# Patient Record
Sex: Female | Born: 1950 | Race: White | Hispanic: No | Marital: Married | Smoking: Never smoker
Health system: Southern US, Community
[De-identification: ages and names within clinical notes are randomized; demographics above are authoritative.]

## PROBLEM LIST (undated history)

## (undated) DIAGNOSIS — F419 Anxiety disorder, unspecified: Secondary | ICD-10-CM

## (undated) DIAGNOSIS — J45909 Unspecified asthma, uncomplicated: Secondary | ICD-10-CM

## (undated) DIAGNOSIS — I10 Essential (primary) hypertension: Secondary | ICD-10-CM

## (undated) DIAGNOSIS — E559 Vitamin D deficiency, unspecified: Secondary | ICD-10-CM

## (undated) DIAGNOSIS — M810 Age-related osteoporosis without current pathological fracture: Secondary | ICD-10-CM

## (undated) HISTORY — PX: WISDOM TOOTH EXTRACTION: SHX21

## (undated) HISTORY — DX: Essential (primary) hypertension: I10

## (undated) HISTORY — DX: Age-related osteoporosis without current pathological fracture: M81.0

## (undated) HISTORY — DX: Unspecified asthma, uncomplicated: J45.909

## (undated) HISTORY — DX: Vitamin D deficiency, unspecified: E55.9

## (undated) HISTORY — DX: Anxiety disorder, unspecified: F41.9

---

## 1985-04-28 HISTORY — PX: TUBAL LIGATION: SHX77

## 1998-01-02 ENCOUNTER — Other Ambulatory Visit: Admission: RE | Admit: 1998-01-02 | Discharge: 1998-01-02 | Payer: Self-pay | Admitting: Obstetrics & Gynecology

## 1999-02-01 ENCOUNTER — Other Ambulatory Visit: Admission: RE | Admit: 1999-02-01 | Discharge: 1999-02-01 | Payer: Self-pay | Admitting: Obstetrics & Gynecology

## 1999-02-04 ENCOUNTER — Other Ambulatory Visit: Admission: RE | Admit: 1999-02-04 | Discharge: 1999-02-04 | Payer: Self-pay | Admitting: Obstetrics & Gynecology

## 1999-09-17 ENCOUNTER — Encounter: Admission: RE | Admit: 1999-09-17 | Discharge: 1999-09-17 | Payer: Self-pay | Admitting: Obstetrics & Gynecology

## 1999-09-17 ENCOUNTER — Encounter: Payer: Self-pay | Admitting: Obstetrics & Gynecology

## 1999-10-21 ENCOUNTER — Other Ambulatory Visit: Admission: RE | Admit: 1999-10-21 | Discharge: 1999-10-21 | Payer: Self-pay | Admitting: Obstetrics & Gynecology

## 2000-06-26 ENCOUNTER — Other Ambulatory Visit: Admission: RE | Admit: 2000-06-26 | Discharge: 2000-06-26 | Payer: Self-pay | Admitting: Obstetrics & Gynecology

## 2010-06-18 ENCOUNTER — Other Ambulatory Visit: Payer: Self-pay | Admitting: Obstetrics & Gynecology

## 2010-06-18 DIAGNOSIS — Z780 Asymptomatic menopausal state: Secondary | ICD-10-CM

## 2010-06-18 DIAGNOSIS — Z1231 Encounter for screening mammogram for malignant neoplasm of breast: Secondary | ICD-10-CM

## 2010-06-27 DIAGNOSIS — M810 Age-related osteoporosis without current pathological fracture: Secondary | ICD-10-CM

## 2010-06-27 HISTORY — DX: Age-related osteoporosis without current pathological fracture: M81.0

## 2010-07-10 ENCOUNTER — Ambulatory Visit
Admission: RE | Admit: 2010-07-10 | Discharge: 2010-07-10 | Disposition: A | Discharge status: Home / Self Care | Payer: Commercial Indemnity | Source: Ambulatory Visit | Attending: Obstetrics & Gynecology | Admitting: Obstetrics & Gynecology

## 2010-07-10 DIAGNOSIS — Z1231 Encounter for screening mammogram for malignant neoplasm of breast: Secondary | ICD-10-CM

## 2010-07-10 DIAGNOSIS — Z780 Asymptomatic menopausal state: Secondary | ICD-10-CM

## 2011-04-29 HISTORY — PX: CHOLECYSTECTOMY, LAPAROSCOPIC: SHX56

## 2011-05-29 DIAGNOSIS — E785 Hyperlipidemia, unspecified: Secondary | ICD-10-CM

## 2011-05-29 HISTORY — DX: Hyperlipidemia, unspecified: E78.5

## 2011-06-23 LAB — HM PAP SMEAR: HM PAP: NEGATIVE

## 2011-07-03 ENCOUNTER — Other Ambulatory Visit: Payer: Self-pay | Admitting: Obstetrics & Gynecology

## 2011-07-03 DIAGNOSIS — Z1231 Encounter for screening mammogram for malignant neoplasm of breast: Secondary | ICD-10-CM

## 2011-07-15 ENCOUNTER — Ambulatory Visit: Payer: Commercial Indemnity

## 2011-07-16 ENCOUNTER — Ambulatory Visit
Admission: RE | Admit: 2011-07-16 | Discharge: 2011-07-16 | Disposition: A | Discharge status: Home / Self Care | Payer: 59 | Source: Ambulatory Visit | Attending: Obstetrics & Gynecology | Admitting: Obstetrics & Gynecology

## 2011-07-16 DIAGNOSIS — Z1231 Encounter for screening mammogram for malignant neoplasm of breast: Secondary | ICD-10-CM

## 2012-05-27 DIAGNOSIS — J45909 Unspecified asthma, uncomplicated: Secondary | ICD-10-CM | POA: Insufficient documentation

## 2012-06-18 ENCOUNTER — Other Ambulatory Visit: Payer: Self-pay | Admitting: Obstetrics & Gynecology

## 2012-06-18 DIAGNOSIS — Z1231 Encounter for screening mammogram for malignant neoplasm of breast: Secondary | ICD-10-CM

## 2012-06-24 ENCOUNTER — Other Ambulatory Visit: Payer: Self-pay | Admitting: Obstetrics & Gynecology

## 2012-06-24 DIAGNOSIS — M810 Age-related osteoporosis without current pathological fracture: Secondary | ICD-10-CM

## 2012-07-16 ENCOUNTER — Ambulatory Visit: Payer: 59

## 2012-09-06 ENCOUNTER — Other Ambulatory Visit: Payer: Self-pay | Admitting: Nurse Practitioner

## 2012-09-06 NOTE — Telephone Encounter (Addendum)
eScribe request for refill on VITAMIN D 16109 Last filled - 06/24/12, #26 X 1 Last AEX - 06/24/12 Next AEX - 06/27/13 Last Vitamin D level - 06/24/12=37 Last filled at pharmacy on 05/20/12.  Pt given written RX on 06/24/12.  RX denied.

## 2012-09-08 ENCOUNTER — Ambulatory Visit
Admission: RE | Admit: 2012-09-08 | Discharge: 2012-09-08 | Disposition: A | Discharge status: Home / Self Care | Payer: 59 | Source: Ambulatory Visit | Attending: Obstetrics & Gynecology | Admitting: Obstetrics & Gynecology

## 2012-09-08 DIAGNOSIS — Z1231 Encounter for screening mammogram for malignant neoplasm of breast: Secondary | ICD-10-CM

## 2012-09-08 DIAGNOSIS — M810 Age-related osteoporosis without current pathological fracture: Secondary | ICD-10-CM

## 2012-09-13 ENCOUNTER — Telehealth: Payer: Self-pay | Admitting: *Deleted

## 2012-09-13 NOTE — Telephone Encounter (Signed)
Message copied by Osie Bond on Mon Sep 13, 2012  1:14 PM ------      Message from: Ria Comment R      Created: Mon Sep 13, 2012  1:08 PM       Let patient know that BMD looks better this time compared to 06/2010.  There has been a significant 3.9 % increase of BMD at the spine.  (Spine is still in the osteoporosis range)  No change at the hip.  She must continue on calcium, vit D and getting exercise especially upper body weights.  Repeat in 2 years. ------

## 2012-09-13 NOTE — Telephone Encounter (Signed)
Left VM for pt to return my call in regards to DEXA results.

## 2012-09-14 ENCOUNTER — Telehealth: Payer: Self-pay | Admitting: *Deleted

## 2012-09-14 NOTE — Telephone Encounter (Signed)
Pt is aware of DEXA results and is agreeable to exercise and vitamin D intake.

## 2012-09-14 NOTE — Telephone Encounter (Signed)
Message copied by Osie Bond on Tue Sep 14, 2012  9:09 AM ------      Message from: Ria Comment R      Created: Mon Sep 13, 2012  1:08 PM       Let patient know that BMD looks better this time compared to 06/2010.  There has been a significant 3.9 % increase of BMD at the spine.  (Spine is still in the osteoporosis range)  No change at the hip.  She must continue on calcium, vit D and getting exercise especially upper body weights.  Repeat in 2 years. ------

## 2012-10-14 ENCOUNTER — Other Ambulatory Visit: Payer: Self-pay | Admitting: Nurse Practitioner

## 2013-06-24 ENCOUNTER — Encounter: Payer: Self-pay | Admitting: Nurse Practitioner

## 2013-06-27 ENCOUNTER — Ambulatory Visit (INDEPENDENT_AMBULATORY_CARE_PROVIDER_SITE_OTHER): Payer: BC Managed Care – PPO | Admitting: Nurse Practitioner

## 2013-06-27 ENCOUNTER — Encounter: Payer: Self-pay | Admitting: Nurse Practitioner

## 2013-06-27 VITALS — BP 124/76 | HR 68 | Ht 62.0 in | Wt 211.0 lb

## 2013-06-27 DIAGNOSIS — R82998 Other abnormal findings in urine: Secondary | ICD-10-CM

## 2013-06-27 DIAGNOSIS — I10 Essential (primary) hypertension: Secondary | ICD-10-CM | POA: Insufficient documentation

## 2013-06-27 DIAGNOSIS — Z01419 Encounter for gynecological examination (general) (routine) without abnormal findings: Secondary | ICD-10-CM

## 2013-06-27 DIAGNOSIS — F411 Generalized anxiety disorder: Secondary | ICD-10-CM | POA: Insufficient documentation

## 2013-06-27 DIAGNOSIS — R8290 Unspecified abnormal findings in urine: Secondary | ICD-10-CM

## 2013-06-27 DIAGNOSIS — M810 Age-related osteoporosis without current pathological fracture: Secondary | ICD-10-CM | POA: Insufficient documentation

## 2013-06-27 DIAGNOSIS — Z0000 Encounter for general adult medical examination without abnormal findings: Secondary | ICD-10-CM

## 2013-06-27 DIAGNOSIS — F419 Anxiety disorder, unspecified: Secondary | ICD-10-CM | POA: Insufficient documentation

## 2013-06-27 DIAGNOSIS — E559 Vitamin D deficiency, unspecified: Secondary | ICD-10-CM | POA: Insufficient documentation

## 2013-06-27 LAB — POCT URINALYSIS DIPSTICK
BILIRUBIN UA: NEGATIVE
GLUCOSE UA: NEGATIVE
Ketones, UA: NEGATIVE
NITRITE UA: NEGATIVE
PH UA: 5
Protein, UA: NEGATIVE
Urobilinogen, UA: NEGATIVE

## 2013-06-27 LAB — HEMOGLOBIN, FINGERSTICK: Hemoglobin, fingerstick: 14 g/dL (ref 12.0–16.0)

## 2013-06-27 MED ORDER — VITAMIN D (ERGOCALCIFEROL) 1.25 MG (50000 UNIT) PO CAPS: ORAL_CAPSULE | ORAL | Status: DC

## 2013-06-27 NOTE — Patient Instructions (Signed)

## 2013-06-27 NOTE — Progress Notes (Signed)
Patient ID: Joanna Preston, female   DOB: [birth year], 63 y.o.   MRN: [id number] 63 y.o. G2P1001 Married Caucasian Fe here for annual exam. Went on part time then now retired since April 27, 2013.  Now spending more time with walking and now does own house keeping.   No LMP recorded. Patient is postmenopausal.          Sexually active: yes  The current method of family planning is post menopausal status.    Exercising: yes  Home exercise routine includes walking 1-2 miles two times daily. Smoker:  no  Health Maintenance: Pap:  06/23/11, WNL, neg HR HPV MMG:  09/08/12, Bi-Rads 1: negative Colonoscopy:  06/2008, "normal" repeat in 10 years IFOB with PCP BMD:  09/14/12, -2.4/-0.6 TDaP:  2015 Shingles vaccine is upcoming  Labs:  HB: 14.0 Urine:  Trace RBC, 1+ leuk's   reports that she has never smoked. She has never used smokeless tobacco. She reports that she does not drink alcohol or use illicit drugs.  Past Medical History  Diagnosis Date  . Vitamin D deficiency   . Asthma   . Osteoporosis 3/12    spine/nl hip  . Hypertension   . Anxiety     Past Surgical History  Procedure Laterality Date  . Cholecystectomy, laparoscopic  2013  . Tubal ligation Bilateral 1987  . Wisdom tooth extraction  age 24    Current Outpatient Prescriptions  Medication Sig Dispense Refill  . Fluticasone-Salmeterol (ADVAIR) 100-50 MCG/DOSE AEPB Inhale 1 puff into the lungs 2 (two) times daily.      Marland Kitchen lisinopril-hydrochlorothiazide (PRINZIDE,ZESTORETIC) 20-12.5 MG per tablet Take 1 tablet by mouth daily.      . Multiple Vitamin (MULTIVITAMIN) tablet Take 1 tablet by mouth daily.      . sertraline (ZOLOFT) 100 MG tablet Take 100 mg by mouth daily.      . Vitamin D, Ergocalciferol, (DRISDOL) 50000 UNITS CAPS take 1 capsule by mouth every week  26 capsule  3   No current facility-administered medications for this visit.    Family History  Problem Relation Age of Onset  . Heart disease Mother   . Heart  failure Mother   . Hypertension Mother   . Hyperlipidemia Mother   . Heart disease Father   . Lung cancer Brother     ROS:  Pertinent items are noted in HPI.  Otherwise, a comprehensive ROS was negative.  Exam:   BP 124/76  Pulse 68  Ht 5\' 2"  (1.575 m)  Wt 211 lb (95.709 kg)  BMI 38.58 kg/m2 Height: 5\' 2"  (157.5 cm)  Ht Readings from Last 3 Encounters:  06/27/13 5\' 2"  (1.575 m)    General appearance: alert, cooperative and appears stated age Head: Normocephalic, without obvious abnormality, atraumatic Neck: no adenopathy, supple, symmetrical, trachea midline and thyroid normal to inspection and palpation Lungs: clear to auscultation bilaterally Breasts: normal appearance, no masses or tenderness Heart: regular rate and rhythm Abdomen: soft, non-tender; no masses,  no organomegaly Extremities: extremities normal, atraumatic, no cyanosis or edema Skin: Skin color, texture, turgor normal. No rashes or lesions Lymph nodes: Cervical, supraclavicular, and axillary nodes normal. No abnormal inguinal nodes palpated Neurologic: Grossly normal   Pelvic: External genitalia:  no lesions              Urethra:  normal appearing urethra with no masses, tenderness or lesions              Bartholin's and Skene's: normal  Vagina: normal appearing vagina with normal color and discharge, no lesions              Cervix: anteverted              Pap taken: no Bimanual Exam:  Uterus:  normal size, contour, position, consistency, mobility, non-tender              Adnexa: no mass, fullness, tenderness               Rectovaginal: Confirms               Anus:  normal sphincter tone, no lesions  A:  Well Woman with normal exam  Postmenopausal - no HRT  History of Vit D deficiency with Osteoporosis  HTN and anxiety -grief reaction with loss of mother 04/23/13  R/O UTI  P:   Pap smear as per guidelines   Mammogram due 08/2013  Refill Vit D for a year - follow with labs  Continue  exercise  Will follow with urine culture - asymptomatic  Counseled on breast self exam, adequate intake of calcium and vitamin D, diet and exercise, Kegel's exercises return annually or prn  An After Visit Summary was printed and given to the patient.

## 2013-06-28 LAB — VITAMIN D 25 HYDROXY (VIT D DEFICIENCY, FRACTURES): VIT D 25 HYDROXY: 33 ng/mL (ref 30–89)

## 2013-06-29 ENCOUNTER — Telehealth: Payer: Self-pay | Admitting: *Deleted

## 2013-06-29 NOTE — Telephone Encounter (Signed)
Message copied by Luisa DagoPHILLIPS, Porter Nakama C on Wed Jun 29, 2013 10:50 AM ------      Message from: Roanna BanningGRUBB, PATRICIA R      Created: Tue Jun 28, 2013  8:47 AM       Let patient know that Vit D is low at 4033, follow protocol ------

## 2013-06-29 NOTE — Telephone Encounter (Signed)
I have attempted to contact this patient by phone with the following results: left message to return my call on answering machine (home/mobile).  

## 2013-06-29 NOTE — Telephone Encounter (Signed)
Spoke with patient. Message from Lauro FranklinPatricia Rolen-Grubb, FNP given. Verbalized understanding. Advised rx sent. To take per instructions.

## 2013-06-29 NOTE — Progress Notes (Signed)
Encounter reviewed by Dr. Brook Silva.  

## 2013-06-29 NOTE — Telephone Encounter (Signed)
Returning a call to Stephanie. °

## 2013-07-03 ENCOUNTER — Other Ambulatory Visit: Payer: Self-pay | Admitting: Nurse Practitioner

## 2013-07-03 DIAGNOSIS — N390 Urinary tract infection, site not specified: Secondary | ICD-10-CM

## 2013-07-03 LAB — URINE CULTURE: Colony Count: 15000

## 2013-07-03 MED ORDER — CIPROFLOXACIN HCL 500 MG PO TABS: 500.0000 mg | ORAL_TABLET | Freq: Two times a day (BID) | ORAL | Status: DC

## 2013-07-25 ENCOUNTER — Ambulatory Visit (INDEPENDENT_AMBULATORY_CARE_PROVIDER_SITE_OTHER): Payer: BC Managed Care – PPO | Admitting: *Deleted

## 2013-07-25 VITALS — BP 130/88 | Resp 14 | Ht 62.0 in | Wt 211.0 lb

## 2013-07-25 DIAGNOSIS — N390 Urinary tract infection, site not specified: Secondary | ICD-10-CM

## 2013-07-25 NOTE — Progress Notes (Signed)
Patient is here for urine TOC; patient states she is feeling better.  Urine culture drawn up and sent to lab for analysis.  Patient aware someone will contact her with the results.  Routed to provider for review, encounter closed.

## 2013-07-26 LAB — URINE CULTURE: Colony Count: 70000

## 2013-09-20 ENCOUNTER — Other Ambulatory Visit: Payer: Self-pay

## 2013-09-20 DIAGNOSIS — Z1231 Encounter for screening mammogram for malignant neoplasm of breast: Secondary | ICD-10-CM

## 2013-09-28 ENCOUNTER — Ambulatory Visit
Admission: RE | Admit: 2013-09-28 | Discharge: 2013-09-28 | Disposition: A | Discharge status: Home / Self Care | Payer: BC Managed Care – PPO | Source: Ambulatory Visit

## 2013-09-28 DIAGNOSIS — Z1231 Encounter for screening mammogram for malignant neoplasm of breast: Secondary | ICD-10-CM

## 2014-02-27 ENCOUNTER — Encounter: Payer: Self-pay | Admitting: Nurse Practitioner

## 2014-06-29 ENCOUNTER — Encounter: Payer: Self-pay | Admitting: Nurse Practitioner

## 2014-06-29 ENCOUNTER — Ambulatory Visit (INDEPENDENT_AMBULATORY_CARE_PROVIDER_SITE_OTHER): Payer: BLUE CROSS/BLUE SHIELD | Admitting: Nurse Practitioner

## 2014-06-29 VITALS — BP 120/72 | HR 72 | Ht 63.0 in | Wt 208.0 lb

## 2014-06-29 DIAGNOSIS — Z0000 Encounter for general adult medical examination without abnormal findings: Secondary | ICD-10-CM | POA: Diagnosis not present

## 2014-06-29 DIAGNOSIS — E559 Vitamin D deficiency, unspecified: Secondary | ICD-10-CM

## 2014-06-29 DIAGNOSIS — R8290 Unspecified abnormal findings in urine: Secondary | ICD-10-CM | POA: Diagnosis not present

## 2014-06-29 DIAGNOSIS — Z01419 Encounter for gynecological examination (general) (routine) without abnormal findings: Secondary | ICD-10-CM | POA: Diagnosis not present

## 2014-06-29 LAB — POCT URINALYSIS DIPSTICK
Bilirubin, UA: NEGATIVE
Glucose, UA: NEGATIVE
Ketones, UA: NEGATIVE
NITRITE UA: NEGATIVE
PH UA: 6.5
PROTEIN UA: NEGATIVE
Urobilinogen, UA: NEGATIVE

## 2014-06-29 LAB — HEMOGLOBIN, FINGERSTICK: HEMOGLOBIN, FINGERSTICK: 14.8 g/dL (ref 12.0–16.0)

## 2014-06-29 MED ORDER — VITAMIN D (ERGOCALCIFEROL) 1.25 MG (50000 UNIT) PO CAPS: ORAL_CAPSULE | ORAL | Status: DC

## 2014-06-29 NOTE — Patient Instructions (Signed)

## 2014-06-29 NOTE — Progress Notes (Signed)
Patient ID: Joanna Preston, female   DOB: August 21, 1950, 64 y.o.   MRN: [id number] 64 y.o. G2P1001 Married  Caucasian Fe here for annual exam.  No new health problems.  Patient's last menstrual period was 04/28/1988 (approximate).          Sexually active: Yes.    The current method of family planning is post menopausal status.    Exercising: Yes.    Home exercise routine includes walking dog 2 miles everyday.  Strength training with light weights.. Smoker:  no  Health Maintenance: Pap:  06/23/11, negative with neg HR HPV MMG:  09/28/13, Bi-Rads 1:  Negative  Colonoscopy:  06/2008, "normal" repeat in 10 years, IFOB with PCP BMD:   09/14/12, -2.4/-0.6 TDaP:  2015 Shingles: 09/26/13 Labs:  HB:  14.8  Urine:  Trace leuk's, trace RBC   reports that she has never smoked. She has never used smokeless tobacco. She reports that she does not drink alcohol or use illicit drugs.  Past Medical History  Diagnosis Date  . Vitamin D deficiency   . Asthma   . Osteoporosis 3/12    spine/nl hip  . Hypertension   . Anxiety     Past Surgical History  Procedure Laterality Date  . Cholecystectomy, laparoscopic  2013  . Tubal ligation Bilateral 1987  . Wisdom tooth extraction  age 30    Current Outpatient Prescriptions  Medication Sig Dispense Refill  . atorvastatin (LIPITOR) 10 MG tablet Take 10 mg by mouth daily.  0  . Fluticasone-Salmeterol (ADVAIR) 100-50 MCG/DOSE AEPB Inhale 1 puff into the lungs 2 (two) times daily.    Marland Kitchen lisinopril-hydrochlorothiazide (PRINZIDE,ZESTORETIC) 20-12.5 MG per tablet Take 1 tablet by mouth daily.    . Multiple Vitamin (MULTIVITAMIN) tablet Take 1 tablet by mouth daily.    . sertraline (ZOLOFT) 50 MG tablet Take 50 mg by mouth daily.  0  . Vitamin D, Ergocalciferol, (DRISDOL) 50000 UNITS CAPS capsule take 1 capsule by mouth every week (Patient not taking: Reported on 06/29/2014) 30 capsule 3   No current facility-administered medications for this visit.    Family History   Problem Relation Age of Onset  . Heart disease Mother   . Heart failure Mother   . Hypertension Mother   . Hyperlipidemia Mother   . Heart disease Father   . Lung cancer Brother     ROS:  Pertinent items are noted in HPI.  Otherwise, a comprehensive ROS was negative.  Exam:   BP 120/72 mmHg  Pulse 72  Ht  (1.6 m)  Wt 208 lb (94.348 kg)  BMI 36.85 kg/m2  LMP 04/28/1988 (Approximate) Height:  (160 cm) Ht Readings from Last 3 Encounters:  06/29/14  (1.6 m)  07/25/13  (1.575 m)  06/27/13  (1.575 m)    General appearance: alert, cooperative and appears stated age Head: Normocephalic, without obvious abnormality, atraumatic Neck: no adenopathy, supple, symmetrical, trachea midline and thyroid normal to inspection and palpation Lungs: clear to auscultation bilaterally Breasts: normal appearance, no masses or tenderness Heart: regular rate and rhythm Abdomen: soft, non-tender; no masses,  no organomegaly Extremities: extremities normal, atraumatic, no cyanosis or edema Skin: Skin color, texture, turgor normal. No rashes or lesions Lymph nodes: Cervical, supraclavicular, and axillary nodes normal. No abnormal inguinal nodes palpated Neurologic: Grossly normal   Pelvic: External genitalia:  no lesions              Urethra:  normal appearing urethra with no masses,  tenderness or lesions              Bartholin's and Skene's: normal                 Vagina: normal appearing vagina with normal color and discharge, no lesions              Cervix: anteverted              Pap taken: No. Bimanual Exam:  Uterus:  normal size, contour, position, consistency, mobility, non-tender              Adnexa: no mass, fullness, tenderness               Rectovaginal: Confirms               Anus:  normal sphincter tone, no lesions  Chaperone present:  no  A:  Well Woman with normal exam  Postmenopausal - no HRT History of Vit D deficiency with  Osteoporosis HTN and anxiety  R/O UTI   P:   Reviewed health and wellness pertinent to exam  Pap smear taken today  Mammogram is due 6/16  Refill Vit D for a year, follow with Vit D results  Follow with urine results  Counseled on breast self exam, mammography screening, adequate intake of calcium and vitamin D, diet and exercise return annually or prn  An After Visit Summary was printed and given to the patient.

## 2014-06-30 LAB — URINE CULTURE

## 2014-06-30 LAB — VITAMIN D 25 HYDROXY (VIT D DEFICIENCY, FRACTURES): Vit D, 25-Hydroxy: 22 ng/mL — ABNORMAL LOW (ref 30–100)

## 2014-07-03 NOTE — Progress Notes (Signed)
Encounter reviewed by Dr. Brook Silva.  

## 2014-09-12 ENCOUNTER — Other Ambulatory Visit: Payer: Self-pay

## 2014-09-12 DIAGNOSIS — Z1231 Encounter for screening mammogram for malignant neoplasm of breast: Secondary | ICD-10-CM

## 2014-10-02 ENCOUNTER — Encounter (INDEPENDENT_AMBULATORY_CARE_PROVIDER_SITE_OTHER): Payer: Self-pay

## 2014-10-02 ENCOUNTER — Ambulatory Visit
Admission: RE | Admit: 2014-10-02 | Discharge: 2014-10-02 | Disposition: A | Discharge status: Home / Self Care | Payer: BLUE CROSS/BLUE SHIELD | Source: Ambulatory Visit

## 2014-10-02 DIAGNOSIS — Z1231 Encounter for screening mammogram for malignant neoplasm of breast: Secondary | ICD-10-CM

## 2015-07-03 ENCOUNTER — Encounter: Payer: Self-pay | Admitting: Nurse Practitioner

## 2015-07-03 ENCOUNTER — Ambulatory Visit (INDEPENDENT_AMBULATORY_CARE_PROVIDER_SITE_OTHER): Payer: BLUE CROSS/BLUE SHIELD | Admitting: Nurse Practitioner

## 2015-07-03 VITALS — BP 116/80 | HR 68 | Ht 63.0 in | Wt 210.0 lb

## 2015-07-03 DIAGNOSIS — Z0000 Encounter for general adult medical examination without abnormal findings: Secondary | ICD-10-CM | POA: Diagnosis not present

## 2015-07-03 DIAGNOSIS — Z01419 Encounter for gynecological examination (general) (routine) without abnormal findings: Secondary | ICD-10-CM

## 2015-07-03 DIAGNOSIS — E559 Vitamin D deficiency, unspecified: Secondary | ICD-10-CM

## 2015-07-03 LAB — POCT URINALYSIS DIPSTICK
Bilirubin, UA: NEGATIVE
Blood, UA: NEGATIVE
Glucose, UA: NEGATIVE
Ketones, UA: NEGATIVE
NITRITE UA: NEGATIVE
Protein, UA: NEGATIVE
UROBILINOGEN UA: NEGATIVE
pH, UA: 5

## 2015-07-03 MED ORDER — VITAMIN D (ERGOCALCIFEROL) 1.25 MG (50000 UNIT) PO CAPS: ORAL_CAPSULE | ORAL | Status: DC

## 2015-07-03 NOTE — Patient Instructions (Signed)

## 2015-07-03 NOTE — Progress Notes (Signed)
Patient ID: Joanna Preston, female   DOB: [birth year], 65 y.o.   MRN: [id number]  65 y.o. G2P1001 Married  Caucasian Fe here for annual exam.  Last HGB AIC 02/2015 was 5.9.  She feels well without any new health problems.  Patient's last menstrual period was 04/28/1988 (approximate).          Sexually active: Yes.    The current method of family planning is tubal ligation and post menopausal status.    Exercising: Yes.    Home exercise routine includes walking trails 6 miles per day. Smoker:  no  Health Maintenance: Pap:  06/23/11, WNL, neg HR HPV MMG: 10/02/14, Bi-Rads 1: Negative Colonoscopy:  06/2008, "normal" repeat in 10 years, Dr. Loreta AveMann BMD:  09/14/12 T-Score, -2.4 Spine / -0.6 Left Femur Neck TDaP: 06/20/13 Shingles: 09/26/13 Pneumonia: Not indicated due to age Hep C and HIV: discuss today Labs: HB: 13.2   Urine: trace leuk's - no symptoms   reports that she has never smoked. She has never used smokeless tobacco. She reports that she does not drink alcohol or use illicit drugs.  Past Medical History  Diagnosis Date  . Vitamin D deficiency   . Asthma   . Osteoporosis 3/12    spine/nl hip  . Hypertension   . Anxiety     Past Surgical History  Procedure Laterality Date  . Cholecystectomy, laparoscopic  2013  . Tubal ligation Bilateral 1987  . Wisdom tooth extraction  age 65    Current Outpatient Prescriptions  Medication Sig Dispense Refill  . atorvastatin (LIPITOR) 10 MG tablet Take 10 mg by mouth daily.  0  . Fluticasone-Salmeterol (ADVAIR) 100-50 MCG/DOSE AEPB Inhale 1 puff into the lungs 2 (two) times daily.    Marland Kitchen. lisinopril-hydrochlorothiazide (PRINZIDE,ZESTORETIC) 20-12.5 MG per tablet Take 1 tablet by mouth daily.    . Multiple Vitamin (MULTIVITAMIN) tablet Take 1 tablet by mouth daily.    . sertraline (ZOLOFT) 50 MG tablet Take 50 mg by mouth daily.  0  . Vitamin D, Ergocalciferol, (DRISDOL) 50000 units CAPS capsule take 1 capsule by mouth every week 30 capsule 3   No  current facility-administered medications for this visit.    Family History  Problem Relation Age of Onset  . Heart disease Mother   . Heart failure Mother   . Hypertension Mother   . Hyperlipidemia Mother   . Heart disease Father   . Lung cancer Brother     ROS:  Pertinent items are noted in HPI.  Otherwise, a comprehensive ROS was negative.  Exam:   BP 116/80 mmHg  Pulse 68  Ht 5\' 3"  (1.6 m)  Wt 210 lb (95.255 kg)  BMI 37.21 kg/m2  LMP 04/28/1988 (Approximate) Height: 5\' 3"  (160 cm) Ht Readings from Last 3 Encounters:  07/03/15 5\' 3"  (1.6 m)  06/29/14 5\' 3"  (1.6 m)  07/25/13 5\' 2"  (1.575 m)    General appearance: alert, cooperative and appears stated age Head: Normocephalic, without obvious abnormality, atraumatic Neck: no adenopathy, supple, symmetrical, trachea midline and thyroid normal to inspection and palpation Lungs: clear to auscultation bilaterally Breasts: normal appearance, no masses or tenderness Heart: regular rate and rhythm Abdomen: soft, non-tender; no masses,  no organomegaly Extremities: extremities normal, atraumatic, no cyanosis or edema Skin: Skin color, texture, turgor normal. No rashes or lesions Lymph nodes: Cervical, supraclavicular, and axillary nodes normal. No abnormal inguinal nodes palpated Neurologic: Grossly normal   Pelvic: External genitalia:  no lesions  Urethra:  normal appearing urethra with no masses, tenderness or lesions              Bartholin's and Skene's: normal                 Vagina: normal appearing vagina with normal color and discharge, no lesions              Cervix: anteverted              Pap taken: Yes.   Bimanual Exam:  Uterus:  normal size, contour, position, consistency, mobility, non-tender              Adnexa: no mass, fullness, tenderness               Rectovaginal: Confirms               Anus:  normal sphincter tone, no lesions  Chaperone present: yes  A:  Well Woman with normal  exam  Postmenopausal - no HRT History of Vit D deficiency with Osteoporosis HTN and anxiety    P:   Reviewed health and wellness pertinent to exam  Pap smear as above  Mammogram is due 09/2015  Follow with labs  Refill on Vit D for a year  Counseled on breast self exam, mammography screening, adequate intake of calcium and vitamin D, diet and exercise, Kegel's exercises return annually or prn  An After Visit Summary was printed and given to the patient.

## 2015-07-04 LAB — HIV ANTIBODY (ROUTINE TESTING W REFLEX): HIV: NONREACTIVE

## 2015-07-04 LAB — HEPATITIS C ANTIBODY: HCV Ab: NEGATIVE

## 2015-07-04 LAB — VITAMIN D 25 HYDROXY (VIT D DEFICIENCY, FRACTURES): Vit D, 25-Hydroxy: 36 ng/mL (ref 30–100)

## 2015-07-05 LAB — IPS PAP TEST WITH HPV

## 2015-07-06 LAB — HEMOGLOBIN, FINGERSTICK: HEMOGLOBIN, FINGERSTICK: 13.2 g/dL (ref 12.0–16.0)

## 2015-07-06 NOTE — Progress Notes (Signed)
Encounter reviewed by Dr. Janean SarkBrook Amundson C. Preston. Consider bone density study.

## 2015-07-17 ENCOUNTER — Telehealth: Payer: Self-pay | Admitting: Nurse Practitioner

## 2015-07-17 ENCOUNTER — Other Ambulatory Visit: Payer: Self-pay | Admitting: Nurse Practitioner

## 2015-07-17 DIAGNOSIS — M810 Age-related osteoporosis without current pathological fracture: Secondary | ICD-10-CM

## 2015-07-17 NOTE — Telephone Encounter (Signed)
Patient was called back about our discussion about her BMD.  She wanted us to order BMD instead of PCP.  I had failed to put in order and now that is done.  She will get BMD in June when she gets Mammo.

## 2015-09-03 ENCOUNTER — Other Ambulatory Visit: Payer: Self-pay | Admitting: Nurse Practitioner

## 2015-09-03 DIAGNOSIS — Z1231 Encounter for screening mammogram for malignant neoplasm of breast: Secondary | ICD-10-CM

## 2015-10-16 ENCOUNTER — Ambulatory Visit
Admission: RE | Admit: 2015-10-16 | Discharge: 2015-10-16 | Disposition: A | Discharge status: Home / Self Care | Payer: BLUE CROSS/BLUE SHIELD | Source: Ambulatory Visit | Attending: Nurse Practitioner | Admitting: Nurse Practitioner

## 2015-10-16 DIAGNOSIS — M810 Age-related osteoporosis without current pathological fracture: Secondary | ICD-10-CM

## 2015-10-16 DIAGNOSIS — Z1231 Encounter for screening mammogram for malignant neoplasm of breast: Secondary | ICD-10-CM

## 2016-02-26 ENCOUNTER — Other Ambulatory Visit: Payer: Self-pay

## 2016-02-26 DIAGNOSIS — E559 Vitamin D deficiency, unspecified: Secondary | ICD-10-CM

## 2016-02-26 MED ORDER — VITAMIN D (ERGOCALCIFEROL) 1.25 MG (50000 UNIT) PO CAPS: ORAL_CAPSULE | ORAL | 0 refills | Status: DC

## 2016-02-26 NOTE — Telephone Encounter (Signed)
Medication refill request: Vitamin D 50,000 units Last AEX:  07/03/15 PG Next AEX: 07/04/16  Last MMG (if hormonal medication request): 10/16/15 BIRADS1 negative Refill authorized: 07/03/15 #30 w/3 refills; today please advise

## 2016-02-27 ENCOUNTER — Other Ambulatory Visit: Payer: Self-pay | Admitting: Nurse Practitioner

## 2016-02-27 DIAGNOSIS — E559 Vitamin D deficiency, unspecified: Secondary | ICD-10-CM

## 2016-02-27 MED ORDER — VITAMIN D (ERGOCALCIFEROL) 1.25 MG (50000 UNIT) PO CAPS: ORAL_CAPSULE | ORAL | 0 refills | Status: DC

## 2016-07-04 ENCOUNTER — Ambulatory Visit: Payer: BLUE CROSS/BLUE SHIELD | Admitting: Nurse Practitioner

## 2016-07-11 NOTE — Progress Notes (Signed)
66 y.o. G64P1001 Married  Caucasian Fe here for annual exam.  On a diet and now down 40 lb. Last year 3/17 wt 210. And now at 172 lb. Diet is 17 day and exercise since July 2017.    Still working past time at [employer]. Son is 73 and not married, she would like to have grandchildren.  Patient's last menstrual period was 04/28/1988 (approximate).          Sexually active: Yes.    The current method of family planning is tubal ligation and post menopausal status.    Exercising: Yes.    Gym/ health club routine includes circuit training (treadmill, wieghts, elliptical, weights, bicycle, weights). Smoker:  no  Health Maintenance: Pap: 07/03/15, Negative with neg HR HPV  06/23/11, Negative with neg HR HPV MMG: 10/16/15, Bi-Rads 1: Negative Colonoscopy: 06/2008, "normal" repeat in 10 years, Dr. Loreta Ave BMD: 10/16/15 T-Score, -2.4 Spine / -0.4 Right Femur Neck / -0.3 Left Femur Neck TDaP: 06/20/13 Shingles: 09/26/13 Pneumonia:05/27/12 Pneumovax, 05/17/16 Prevnar-13 Hep C and HIV: 07/03/15 Labs: PCP takes care of labs in Care Everywhere   reports that she has never smoked. She has never used smokeless tobacco. She reports that she does not drink alcohol or use drugs.  Past Medical History:  Diagnosis Date  . Anxiety   . Asthma   . Hypertension   . Osteoporosis 3/12   spine/nl hip  . Vitamin D deficiency     Past Surgical History:  Procedure Laterality Date  . CHOLECYSTECTOMY, LAPAROSCOPIC  2013  . TUBAL LIGATION Bilateral 1987  . WISDOM TOOTH EXTRACTION  age 63    Current Outpatient Prescriptions  Medication Sig Dispense Refill  . atorvastatin (LIPITOR) 10 MG tablet Take 10 mg by mouth daily.  0  . Fluticasone-Salmeterol (ADVAIR) 100-50 MCG/DOSE AEPB Inhale 1 puff into the lungs 2 (two) times daily.    Marland Kitchen lisinopril-hydrochlorothiazide (PRINZIDE,ZESTORETIC) 20-12.5 MG per tablet Take 1 tablet by mouth daily.    . Multiple Vitamin (MULTIVITAMIN) tablet Take 1 tablet by mouth daily.    . sertraline  (ZOLOFT) 50 MG tablet Take 50 mg by mouth daily.  0  . Vitamin D, Ergocalciferol, (DRISDOL) 50000 units CAPS capsule take 1 capsule by mouth every week 30 capsule 0   No current facility-administered medications for this visit.     Family History  Problem Relation Age of Onset  . Heart disease Mother   . Heart failure Mother   . Hypertension Mother   . Hyperlipidemia Mother   . Heart disease Father   . Lung cancer Brother     ROS:  Pertinent items are noted in HPI.  Otherwise, a comprehensive ROS was negative.  Exam:   BP 110/66 (BP Location: Right Arm, Patient Position: Sitting, Cuff Size: Normal)   Pulse 64   Ht 5' 2.25" (1.581 m)   Wt 172 lb (78 kg)   LMP 04/28/1988 (Approximate)   BMI 31.21 kg/m  Height: 5' 2.25" (158.1 cm) Ht Readings from Last 3 Encounters:  07/14/16 5' 2.25" (1.581 m)  07/03/15 5\' 3"  (1.6 m)  06/29/14 5\' 3"  (1.6 m)    General appearance: alert, cooperative and appears stated age Head: Normocephalic, without obvious abnormality, atraumatic Neck: no adenopathy, supple, symmetrical, trachea midline and thyroid normal to inspection and palpation Lungs: clear to auscultation bilaterally Breasts: normal appearance, no masses or tenderness Heart: regular rate and rhythm Abdomen: soft, non-tender; no masses,  no organomegaly Extremities: extremities normal, atraumatic, no cyanosis or edema Skin: Skin  color, texture, turgor normal. No rashes or lesions Lymph nodes: Cervical, supraclavicular, and axillary nodes normal. No abnormal inguinal nodes palpated Neurologic: Grossly normal   Pelvic: External genitalia:  no lesions              Urethra:  normal appearing urethra with no masses, tenderness or lesions              Bartholin's and Skene's: normal                 Vagina: normal appearing vagina with normal color and discharge, no lesions              Cervix: anteverted              Pap taken: No. Bimanual Exam:  Uterus:  normal size, contour,  position, consistency, mobility, non-tender              Adnexa: no mass, fullness, tenderness               Rectovaginal: Confirms               Anus:  normal sphincter tone, no lesions  Chaperone present: yes  A:  Well Woman with normal exam     Postmenopausal - no HRT History of Vit D deficiency with Osteoporosis HTN and anxiety    P:   Reviewed health and wellness pertinent to exam  Pap smear not done  Mammogram is due 6/18  Refill on Vit D and follow  IFOB is given today  Counseled on breast self exam, mammography screening, adequate intake of calcium and vitamin D, diet and exercise return annually or prn  An After Visit Summary was printed and given to the patient.

## 2016-07-14 ENCOUNTER — Ambulatory Visit (INDEPENDENT_AMBULATORY_CARE_PROVIDER_SITE_OTHER): Payer: Medicare HMO | Admitting: Nurse Practitioner

## 2016-07-14 ENCOUNTER — Encounter: Payer: Self-pay | Admitting: Nurse Practitioner

## 2016-07-14 VITALS — BP 110/66 | HR 64 | Ht 62.25 in | Wt 172.0 lb

## 2016-07-14 DIAGNOSIS — E559 Vitamin D deficiency, unspecified: Secondary | ICD-10-CM

## 2016-07-14 DIAGNOSIS — Z01411 Encounter for gynecological examination (general) (routine) with abnormal findings: Secondary | ICD-10-CM | POA: Diagnosis not present

## 2016-07-14 DIAGNOSIS — Z0000 Encounter for general adult medical examination without abnormal findings: Secondary | ICD-10-CM | POA: Diagnosis not present

## 2016-07-14 DIAGNOSIS — Z1211 Encounter for screening for malignant neoplasm of colon: Secondary | ICD-10-CM

## 2016-07-14 MED ORDER — VITAMIN D (ERGOCALCIFEROL) 1.25 MG (50000 UNIT) PO CAPS: ORAL_CAPSULE | ORAL | 3 refills | Status: DC

## 2016-07-14 NOTE — Patient Instructions (Signed)

## 2016-07-15 ENCOUNTER — Other Ambulatory Visit: Payer: Self-pay | Admitting: Nurse Practitioner

## 2016-07-15 DIAGNOSIS — E559 Vitamin D deficiency, unspecified: Secondary | ICD-10-CM

## 2016-07-15 LAB — VITAMIN D 25 HYDROXY (VIT D DEFICIENCY, FRACTURES): VIT D 25 HYDROXY: 68 ng/mL (ref 30–100)

## 2016-07-15 MED ORDER — VITAMIN D (ERGOCALCIFEROL) 1.25 MG (50000 UNIT) PO CAPS: ORAL_CAPSULE | ORAL | 1 refills | Status: DC

## 2016-07-15 NOTE — Progress Notes (Signed)
RX change to reflect her most recent labs.

## 2016-07-17 NOTE — Progress Notes (Signed)
Encounter reviewed by Dr. Brook Amundson C. Silva.  

## 2016-07-18 ENCOUNTER — Telehealth: Payer: Self-pay

## 2016-07-18 NOTE — Telephone Encounter (Signed)
Left message for patient to call Joanna Preston back.  

## 2016-07-18 NOTE — Telephone Encounter (Signed)
-----   Message from Ria CommentPatricia Grubb, FNP sent at 07/15/2016  7:45 AM EDT ----- Please let pt know that Vit D is now at 68 vs 36 from last year.  She is now to reduce Vit D RX to every other week.  Order for RX has been changed to pharmacy -but if she has any left at home to mark her bottle.

## 2016-07-18 NOTE — Telephone Encounter (Signed)
Patient advised of lab results. Patient verbalized understanding and agreement.  

## 2016-09-19 ENCOUNTER — Encounter: Payer: Self-pay | Admitting: *Deleted

## 2016-09-19 NOTE — Progress Notes (Signed)
Patient has not returned IFOB kit. Letter mailed and order extended an additional 30 days.

## 2016-09-25 ENCOUNTER — Other Ambulatory Visit: Payer: Self-pay | Admitting: Obstetrics & Gynecology

## 2016-09-25 ENCOUNTER — Other Ambulatory Visit: Payer: Self-pay | Admitting: Nurse Practitioner

## 2016-09-25 DIAGNOSIS — Z1231 Encounter for screening mammogram for malignant neoplasm of breast: Secondary | ICD-10-CM

## 2016-10-21 ENCOUNTER — Ambulatory Visit
Admission: RE | Admit: 2016-10-21 | Discharge: 2016-10-21 | Disposition: A | Discharge status: Home / Self Care | Payer: Medicare HMO | Source: Ambulatory Visit | Attending: Obstetrics & Gynecology | Admitting: Obstetrics & Gynecology

## 2016-10-21 DIAGNOSIS — Z1231 Encounter for screening mammogram for malignant neoplasm of breast: Secondary | ICD-10-CM

## 2016-10-22 LAB — FECAL OCCULT BLOOD, IMMUNOCHEMICAL: IFOBT: NEGATIVE

## 2016-10-23 NOTE — Addendum Note (Signed)
Addended by: Zenovia JordanMITCHELL, Marili Vader A on: 10/23/2016 09:35 AM   Modules accepted: Orders

## 2016-11-25 ENCOUNTER — Telehealth: Payer: Self-pay | Admitting: Obstetrics & Gynecology

## 2016-11-25 NOTE — Telephone Encounter (Signed)
Left patient a message to call back to reschedule a future appointment that was cancelled by the provider for AEx. °

## 2017-07-14 NOTE — Progress Notes (Signed)
67 y.o. [id number] Married  Caucasian Fe here for annual exam. 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 Postmenopausal no vaginal bleeding or vaginal dryness. Sees PCP Billee CashingSarah Spencer aex/labs/cholesterol/asthma/hypertension management. Vitamin D deficiency with lab here, on Rx every week working well. Some weight gain since last year, working on. Usually hikes and plans to start once weather breaks. No other health issues today.  Patient's last menstrual period was 04/28/1988 (approximate).          Sexually active: Yes.    The current method of family planning is tubal ligation.    Exercising: Yes.    gym 2-4 x weekly - yoga Smoker:  no  Health Maintenance: Pap:  07-03-15 neg HPV HR neg History of Abnormal Pap: no MMG:  10-21-16 category c density birads 1:neg Self Breast exams: yes Colonoscopy:  2010 BMD:   2017 TDaP:  2015 Shingles: 2015 Pneumonia: 2018 Hep C and HIV: both neg 2017 Labs: PCP   reports that  has never smoked. she has never used smokeless tobacco. She reports that she does not drink alcohol or use drugs.  Past Medical History:  Diagnosis Date  . Anxiety   . Asthma   . Hypertension   . Osteoporosis 3/12   spine/nl hip  . Vitamin D deficiency     Past Surgical History:  Procedure Laterality Date  . CHOLECYSTECTOMY, LAPAROSCOPIC  2013  . TUBAL LIGATION Bilateral 1987  . WISDOM TOOTH EXTRACTION  age 67    Current Outpatient Medications  Medication Sig Dispense Refill  . atorvastatin (LIPITOR) 10 MG tablet Take 10 mg by mouth daily.  0  . Fluticasone-Salmeterol (ADVAIR) 100-50 MCG/DOSE AEPB Inhale 1 puff into the lungs 2 (two) times daily.    Marland Kitchen. lisinopril-hydrochlorothiazide (PRINZIDE,ZESTORETIC) 20-12.5 MG per tablet Take 1 tablet by mouth daily.    . Multiple Vitamin (MULTIVITAMIN) tablet Take 1 tablet by mouth daily.    . sertraline (ZOLOFT) 50 MG tablet Take 50 mg by mouth daily.  0  . Vitamin D, Ergocalciferol, (DRISDOL) 50000 units CAPS  capsule take 1 capsule by mouth every other week 30 capsule 1   No current facility-administered medications for this visit.     Family History  Problem Relation Age of Onset  . Heart disease Mother   . Heart failure Mother   . Hypertension Mother   . Hyperlipidemia Mother   . Heart disease Father   . Lung cancer Brother   . Breast cancer Neg Hx     ROS:  Pertinent items are noted in HPI.  Otherwise, a comprehensive ROS was negative.  Exam:   BP 126/76 (BP Location: Left Arm, Patient Position: Sitting, Cuff Size: Large)   Pulse 80   Resp 16   Ht 5' 2.25" (1.581 m)   Wt 186 lb (84.4 kg)   LMP 04/28/1988 (Approximate)   BMI 33.75 kg/m  Height: 5' 2.25" (158.1 cm) Ht Readings from Last 3 Encounters:  07/15/17 5' 2.25" (1.581 m)  07/14/16 5' 2.25" (1.581 m)  07/03/15 5\' 3"  (1.6 m)    General appearance: alert, cooperative and appears stated age Head: Normocephalic, without obvious abnormality, atraumatic Neck: no adenopathy, supple, symmetrical, trachea midline and thyroid normal to inspection and palpation Lungs: clear to auscultation bilaterally Breasts: normal appearance, no masses or tenderness, No nipple retraction or dimpling, No nipple discharge or bleeding, No axillary or supraclavicular adenopathy, inverted nipples bilateral Heart: regular rate and rhythm Abdomen: soft, non-tender; no masses,  no organomegaly Extremities: extremities normal,  atraumatic, no cyanosis or edema Skin: Skin color, texture, turgor normal. No rashes or lesions Lymph nodes: Cervical, supraclavicular, and axillary nodes normal. No abnormal inguinal nodes palpated Neurologic: Grossly normal   Pelvic: External genitalia:  no lesions              Urethra:  normal appearing urethra with no masses, tenderness or lesions              Bartholin's and Skene's: normal                 Vagina: normal appearing vagina with normal color and discharge, no lesions              Cervix: multiparous  appearance, no cervical motion tenderness and no lesions              Pap taken: No. Bimanual Exam:  Uterus:  normal size, contour, position, consistency, mobility, non-tender and anteverted              Adnexa: normal adnexa and no mass, fullness, tenderness               Rectovaginal: Confirms               Anus:  normal sphincter tone, no lesions  Chaperone present: yes  A:  Well Woman with normal exam  Post menopausal no HRT  Vitamin d deficiency  Asthma/hypertension/cholesterol/anxiety with PCP management  P:   Reviewed health and wellness pertinent to exam  Discussed need to advise if vaginal bleeding occurs.  Lab: Vitamin D  Continue follow up with PCP as indicated  Pap smear: no   counseled on breast self exam, mammography screening, adequate intake of calcium and vitamin D, diet and exercise  return annually or prn  An After Visit Summary was printed and given to the patient.

## 2017-07-15 ENCOUNTER — Ambulatory Visit (INDEPENDENT_AMBULATORY_CARE_PROVIDER_SITE_OTHER): Payer: Medicare HMO | Admitting: Certified Nurse Midwife

## 2017-07-15 ENCOUNTER — Other Ambulatory Visit: Payer: Self-pay

## 2017-07-15 ENCOUNTER — Encounter: Payer: Self-pay | Admitting: Certified Nurse Midwife

## 2017-07-15 ENCOUNTER — Ambulatory Visit: Payer: Medicare HMO | Admitting: Nurse Practitioner

## 2017-07-15 VITALS — BP 126/76 | HR 80 | Resp 16 | Ht 62.25 in | Wt 186.0 lb

## 2017-07-15 DIAGNOSIS — Z01419 Encounter for gynecological examination (general) (routine) without abnormal findings: Secondary | ICD-10-CM | POA: Diagnosis not present

## 2017-07-15 DIAGNOSIS — Z780 Asymptomatic menopausal state: Secondary | ICD-10-CM | POA: Diagnosis not present

## 2017-07-15 DIAGNOSIS — E559 Vitamin D deficiency, unspecified: Secondary | ICD-10-CM | POA: Diagnosis not present

## 2017-07-15 NOTE — Patient Instructions (Signed)

## 2017-07-16 ENCOUNTER — Other Ambulatory Visit: Payer: Self-pay | Admitting: Obstetrics and Gynecology

## 2017-07-16 ENCOUNTER — Other Ambulatory Visit: Payer: Self-pay | Admitting: *Deleted

## 2017-07-16 DIAGNOSIS — E559 Vitamin D deficiency, unspecified: Secondary | ICD-10-CM

## 2017-07-16 LAB — VITAMIN D 25 HYDROXY (VIT D DEFICIENCY, FRACTURES): VIT D 25 HYDROXY: 26.6 ng/mL — AB (ref 30.0–100.0)

## 2017-09-14 ENCOUNTER — Other Ambulatory Visit: Payer: Self-pay | Admitting: Obstetrics & Gynecology

## 2017-09-14 DIAGNOSIS — Z1231 Encounter for screening mammogram for malignant neoplasm of breast: Secondary | ICD-10-CM

## 2017-11-02 ENCOUNTER — Ambulatory Visit: Payer: Medicare HMO

## 2017-11-09 ENCOUNTER — Telehealth: Payer: Self-pay | Admitting: Certified Nurse Midwife

## 2017-11-09 ENCOUNTER — Other Ambulatory Visit: Payer: Self-pay

## 2017-11-09 DIAGNOSIS — E559 Vitamin D deficiency, unspecified: Secondary | ICD-10-CM

## 2017-11-09 NOTE — Telephone Encounter (Signed)
Medication refill request: vitamin D Last AEX:  07/15/17 Next AEX: 08/03/18 Last MMG (if hormonal medication request): /26/18 Bi-rads Category 1 neg Refill authorized: Please refill if appropriate.

## 2017-11-09 NOTE — Telephone Encounter (Signed)
Patient is requesting refills of her vitamin d to the pharmacy on file .

## 2017-11-12 ENCOUNTER — Other Ambulatory Visit: Payer: Self-pay

## 2017-11-12 DIAGNOSIS — E559 Vitamin D deficiency, unspecified: Secondary | ICD-10-CM

## 2017-11-12 NOTE — Telephone Encounter (Signed)
I tried to call the patient to schedule vitamin D recheck.

## 2017-11-12 NOTE — Telephone Encounter (Signed)
Patient returning call to [facility]. 

## 2017-11-12 NOTE — Telephone Encounter (Signed)
Patient called to check on the status of the refill request for her vitamin D. She said she is completely out now.

## 2017-11-12 NOTE — Telephone Encounter (Signed)
Medication refill request: Drisdol 50,000 units  Last AEX:   07/15/17 Next AEX: 08/03/18 Last MMG (if hormonal medication request): 10/21/16 Bi-rads Category 1 neg  Refill authorized: Please advise.

## 2017-11-12 NOTE — Telephone Encounter (Signed)
Per note in chart she was to take her vitamin d and come in for recheck in 3 months

## 2017-11-12 NOTE — Telephone Encounter (Signed)
I called patient to get her scheduled for a lab appointment to recheck Vitamin D.

## 2017-11-13 NOTE — Telephone Encounter (Signed)
Patient scheduled appointment for recheck. Rx denied.

## 2017-11-13 NOTE — Telephone Encounter (Signed)
Patient returning call.

## 2017-11-18 ENCOUNTER — Ambulatory Visit
Admission: RE | Admit: 2017-11-18 | Discharge: 2017-11-18 | Disposition: A | Discharge status: Home / Self Care | Payer: Medicare HMO | Source: Ambulatory Visit | Attending: Obstetrics & Gynecology | Admitting: Obstetrics & Gynecology

## 2017-11-18 ENCOUNTER — Other Ambulatory Visit (INDEPENDENT_AMBULATORY_CARE_PROVIDER_SITE_OTHER): Payer: Medicare HMO

## 2017-11-18 DIAGNOSIS — Z1231 Encounter for screening mammogram for malignant neoplasm of breast: Secondary | ICD-10-CM

## 2017-11-18 DIAGNOSIS — E559 Vitamin D deficiency, unspecified: Secondary | ICD-10-CM

## 2017-11-19 LAB — VITAMIN D 25 HYDROXY (VIT D DEFICIENCY, FRACTURES): VIT D 25 HYDROXY: 38.6 ng/mL (ref 30.0–100.0)

## 2018-08-03 ENCOUNTER — Ambulatory Visit: Payer: Medicare HMO | Admitting: Certified Nurse Midwife

## 2018-09-24 ENCOUNTER — Telehealth: Payer: Self-pay | Admitting: Certified Nurse Midwife

## 2018-09-24 NOTE — Telephone Encounter (Signed)
Left message on voicemail to call and reschedule cancelled appointment. °

## 2018-10-13 ENCOUNTER — Other Ambulatory Visit: Payer: Self-pay | Admitting: Obstetrics & Gynecology

## 2018-10-13 DIAGNOSIS — Z1231 Encounter for screening mammogram for malignant neoplasm of breast: Secondary | ICD-10-CM

## 2018-10-25 ENCOUNTER — Ambulatory Visit (INDEPENDENT_AMBULATORY_CARE_PROVIDER_SITE_OTHER): Payer: Medicare HMO | Admitting: Certified Nurse Midwife

## 2018-10-25 ENCOUNTER — Encounter: Payer: Self-pay | Admitting: Certified Nurse Midwife

## 2018-10-25 ENCOUNTER — Other Ambulatory Visit (HOSPITAL_COMMUNITY)
Admission: RE | Admit: 2018-10-25 | Discharge: 2018-10-25 | Disposition: A | Discharge status: Home / Self Care | Payer: Medicare HMO | Source: Ambulatory Visit | Attending: Certified Nurse Midwife | Admitting: Certified Nurse Midwife

## 2018-10-25 ENCOUNTER — Other Ambulatory Visit: Payer: Self-pay

## 2018-10-25 VITALS — BP 118/80 | HR 70 | Temp 97.4°F | Resp 16 | Ht 62.0 in | Wt 164.0 lb

## 2018-10-25 DIAGNOSIS — Z124 Encounter for screening for malignant neoplasm of cervix: Secondary | ICD-10-CM | POA: Diagnosis present

## 2018-10-25 DIAGNOSIS — N898 Other specified noninflammatory disorders of vagina: Secondary | ICD-10-CM

## 2018-10-25 DIAGNOSIS — Z8739 Personal history of other diseases of the musculoskeletal system and connective tissue: Secondary | ICD-10-CM

## 2018-10-25 DIAGNOSIS — R87610 Atypical squamous cells of undetermined significance on cytologic smear of cervix (ASC-US): Secondary | ICD-10-CM | POA: Insufficient documentation

## 2018-10-25 DIAGNOSIS — Z01419 Encounter for gynecological examination (general) (routine) without abnormal findings: Secondary | ICD-10-CM | POA: Diagnosis not present

## 2018-10-25 DIAGNOSIS — Z1151 Encounter for screening for human papillomavirus (HPV): Secondary | ICD-10-CM | POA: Diagnosis not present

## 2018-10-25 DIAGNOSIS — Z780 Asymptomatic menopausal state: Secondary | ICD-10-CM | POA: Insufficient documentation

## 2018-10-25 NOTE — Progress Notes (Signed)
68 y.o. G35P1101 Married  Caucasian Fe here for annual exam. Post menopausal no HRT. Denies vaginal bleeding or vaginal dryness. Staying busy with caring for mother-in-law and tired at times. Spouse no support, but helps at home. Sees PCP Mattie Marlin for cholesterol, allergies, hypertension, anxiety, osteoporosis. No other health issues today.  Patient's last menstrual period was 04/28/1988 (approximate).          Sexually active: No.  The current method of family planning is tubal ligation.    Exercising: Yes.    walking Smoker:  no  Review of Systems  Constitutional: Negative.   HENT: Negative.   Eyes: Negative.   Respiratory: Negative.   Cardiovascular: Negative.   Gastrointestinal: Negative.   Genitourinary: Negative.   Musculoskeletal: Negative.   Skin: Negative.   Neurological: Negative.   Endo/Heme/Allergies: Negative.   Psychiatric/Behavioral: Negative.     Health Maintenance: Pap:  07-03-15 neg HPV HR neg History of Abnormal Pap: no MMG:  11-18-17 category b density birads 1:neg Self Breast exams: yes Colonoscopy:  2010, does cologard BMD:   2017 TDaP:  2015 Shingles: 2015 Pneumonia: 2018 Hep C and HIV: both neg 2017 Labs: PCP   reports that she has never smoked. She has never used smokeless tobacco. She reports that she does not drink alcohol or use drugs.  Past Medical History:  Diagnosis Date  . Anxiety   . Asthma   . Hypertension   . Osteoporosis 3/12   spine/nl hip  . Vitamin D deficiency     Past Surgical History:  Procedure Laterality Date  . CHOLECYSTECTOMY, LAPAROSCOPIC  2013  . TUBAL LIGATION Bilateral 1987  . WISDOM TOOTH EXTRACTION  age 35    Current Outpatient Medications  Medication Sig Dispense Refill  . albuterol (VENTOLIN HFA) 108 (90 Base) MCG/ACT inhaler INHALE 2 PUFFS INTO THE LUNGS EVERY 4 HOURS AS NEEDED FOR WHEEZING OR SHORTNESS OF BREATH    . atorvastatin (LIPITOR) 10 MG tablet Take 10 mg by mouth daily.  0  .  Fluticasone-Salmeterol (ADVAIR) 100-50 MCG/DOSE AEPB Inhale 1 puff into the lungs 2 (two) times daily.    Marland Kitchen lisinopril-hydrochlorothiazide (PRINZIDE,ZESTORETIC) 20-12.5 MG per tablet Take 1 tablet by mouth daily.    . montelukast (SINGULAIR) 10 MG tablet TK 1 T PO QD    . Multiple Vitamin (MULTIVITAMIN) tablet Take 1 tablet by mouth daily.    . sertraline (ZOLOFT) 50 MG tablet Take 50 mg by mouth daily.  0  . WIXELA INHUB 250-50 MCG/DOSE AEPB INHALE 1 PUFF INTO THE LUNGS TWICE DAILY     No current facility-administered medications for this visit.     Family History  Problem Relation Age of Onset  . Heart disease Mother   . Heart failure Mother   . Hypertension Mother   . Hyperlipidemia Mother   . Heart disease Father   . Lung cancer Brother   . Breast cancer Neg Hx     ROS:  Pertinent items are noted in HPI.  Otherwise, a comprehensive ROS was negative.  Exam:   BP 118/80   Pulse 70   Temp (!) 97.4 F (36.3 C) (Skin)   Resp 16   Ht 5\' 2"  (1.575 m)   Wt 164 lb (74.4 kg)   LMP 04/28/1988 (Approximate)   BMI 30.00 kg/m  Height: 5\' 2"  (157.5 cm) Ht Readings from Last 3 Encounters:  10/25/18 5\' 2"  (1.575 m)  07/15/17 5' 2.25" (1.581 m)  07/14/16 5' 2.25" (1.581 m)  General appearance: alert, cooperative and appears stated age Head: Normocephalic, without obvious abnormality, atraumatic Neck: no adenopathy, supple, symmetrical, trachea midline and thyroid normal to inspection and palpation Lungs: clear to auscultation bilaterally Breasts: normal appearance, no masses or tenderness, No nipple retraction or dimpling, No nipple discharge or bleeding, No axillary or supraclavicular adenopathy, inverted nipples bilateral Heart: regular rate and rhythm Abdomen: soft, non-tender; no masses,  no organomegaly Extremities: extremities normal, atraumatic, no cyanosis or edema Skin: Skin color, texture, turgor normal. No rashes or lesions Lymph nodes: Cervical, supraclavicular, and  axillary nodes normal. No abnormal inguinal nodes palpated Neurologic: Grossly normal   Pelvic: External genitalia:  no lesions              Urethra:  normal appearing urethra with no masses, tenderness or lesions              Bartholin's and Skene's: normal                 Vagina: normal appearing vagina with normal color and discharge, no lesions, very slight dryness at introitus, no scaling or tenderness, patient aware              Cervix: no bleeding following Pap, no cervical motion tenderness, no lesions and normal appearance              Pap taken: Yes.   Bimanual Exam:  Uterus:  normal size, contour, position, consistency, mobility, non-tender and anteverted              Adnexa: normal adnexa and no mass, fullness, tenderness               Rectovaginal: Confirms               Anus:  normal sphincter tone, no lesions  Chaperone present: yes  A:  Well Woman with normal exam  Post menopausal no HRT  Vaginal dryness mild  Hypertension, cholesterol,osteoporosis, asthma with PCP management  Social stress with caring for mother-in-law  P:   Reviewed health and wellness pertinent to exam  Aware of need to advise if vaginal bleeding  Discussed vaginal dryness external and coconut oil use. Patient will try and advise if no change or problems  Continue follow up with PCP as indicated  Stressed time for self and seek other family assistance as needed  Pap smear: yes  counseled on breast self exam, mammography screening, feminine hygiene, menopause, osteoporosis, adequate intake of calcium and vitamin D, diet and exercise, Kegel's exercises  return annually or prn  An After Visit Summary was printed and given to the patient.

## 2018-10-28 LAB — CYTOLOGY - PAP
Diagnosis: UNDETERMINED — AB
HPV: NOT DETECTED

## 2018-11-03 ENCOUNTER — Telehealth: Payer: Self-pay

## 2018-11-03 NOTE — Telephone Encounter (Signed)
Left message for call back.

## 2018-11-03 NOTE — Telephone Encounter (Signed)
-----   Message from Regina Eck, CNM sent at 11/02/2018  4:59 PM EDT ----- 02 yes

## 2018-11-03 NOTE — Telephone Encounter (Signed)
Patient notified of results. See lab 

## 2018-11-30 ENCOUNTER — Ambulatory Visit
Admission: RE | Admit: 2018-11-30 | Discharge: 2018-11-30 | Disposition: A | Discharge status: Home / Self Care | Payer: Medicare HMO | Source: Ambulatory Visit | Attending: Obstetrics & Gynecology | Admitting: Obstetrics & Gynecology

## 2018-11-30 ENCOUNTER — Other Ambulatory Visit: Payer: Self-pay

## 2018-11-30 DIAGNOSIS — Z1231 Encounter for screening mammogram for malignant neoplasm of breast: Secondary | ICD-10-CM

## 2018-12-17 ENCOUNTER — Other Ambulatory Visit: Payer: Self-pay | Admitting: Physician Assistant

## 2018-12-17 DIAGNOSIS — M810 Age-related osteoporosis without current pathological fracture: Secondary | ICD-10-CM

## 2019-03-02 ENCOUNTER — Other Ambulatory Visit: Payer: Self-pay

## 2019-03-02 ENCOUNTER — Ambulatory Visit
Admission: RE | Admit: 2019-03-02 | Discharge: 2019-03-02 | Disposition: A | Discharge status: Home / Self Care | Payer: Medicare HMO | Source: Ambulatory Visit | Attending: Physician Assistant | Admitting: Physician Assistant

## 2019-03-02 DIAGNOSIS — M810 Age-related osteoporosis without current pathological fracture: Secondary | ICD-10-CM

## 2019-07-20 ENCOUNTER — Encounter: Payer: Self-pay | Admitting: Certified Nurse Midwife

## 2019-10-24 ENCOUNTER — Other Ambulatory Visit: Payer: Self-pay | Admitting: Obstetrics & Gynecology

## 2019-10-24 DIAGNOSIS — Z1231 Encounter for screening mammogram for malignant neoplasm of breast: Secondary | ICD-10-CM

## 2019-10-26 ENCOUNTER — Ambulatory Visit: Payer: Medicare HMO | Admitting: Certified Nurse Midwife

## 2019-12-05 ENCOUNTER — Ambulatory Visit
Admission: RE | Admit: 2019-12-05 | Discharge: 2019-12-05 | Disposition: A | Discharge status: Home / Self Care | Payer: Medicare HMO | Source: Ambulatory Visit | Attending: Obstetrics & Gynecology | Admitting: Obstetrics & Gynecology

## 2019-12-05 ENCOUNTER — Other Ambulatory Visit: Payer: Self-pay

## 2019-12-05 DIAGNOSIS — Z1231 Encounter for screening mammogram for malignant neoplasm of breast: Secondary | ICD-10-CM

## 2020-01-26 ENCOUNTER — Ambulatory Visit: Payer: Medicare HMO | Admitting: Obstetrics and Gynecology

## 2020-05-16 ENCOUNTER — Other Ambulatory Visit: Payer: Self-pay

## 2020-05-16 ENCOUNTER — Ambulatory Visit: Payer: Medicare HMO | Admitting: Obstetrics and Gynecology

## 2020-05-16 ENCOUNTER — Encounter: Payer: Self-pay | Admitting: Obstetrics and Gynecology

## 2020-05-16 VITALS — BP 118/78 | HR 92 | Ht 62.0 in | Wt 196.0 lb

## 2020-05-16 DIAGNOSIS — Z8739 Personal history of other diseases of the musculoskeletal system and connective tissue: Secondary | ICD-10-CM

## 2020-05-16 DIAGNOSIS — B372 Candidiasis of skin and nail: Secondary | ICD-10-CM | POA: Diagnosis not present

## 2020-05-16 DIAGNOSIS — Z01419 Encounter for gynecological examination (general) (routine) without abnormal findings: Secondary | ICD-10-CM | POA: Diagnosis not present

## 2020-05-16 MED ORDER — NYSTATIN 100000 UNIT/GM EX CREA: 1.0000 "application " | TOPICAL_CREAM | Freq: Two times a day (BID) | CUTANEOUS | 0 refills | Status: AC

## 2020-05-16 NOTE — Progress Notes (Signed)
70 y.o. G36P1101 Married White or Caucasian Not Hispanic or Latino female here for annual exam.  No vaginal bleeding. Not sexually active. No bowel or bladder c/o.     Patient's last menstrual period was 04/28/1988 (approximate).          Sexually active: No.  The current method of family planning is tubal ligation.    Exercising: Yes.    pickle ball Smoker:  no  Health Maintenance: Pap:   10/25/18 ASCUS:Neg HR HPV  07/03/15 Neg:Neg HR HPV History of abnormal Pap:  Yes, in her 30's. F/U's were normal.  MMG:  12/05/19 BIRADS 1 negative/density b BMD:   03/02/19 Osteopenia, T score of -2.3. FRAX 13/1.3% Colonoscopy: patient does Cologuard TDaP:  2015 Gardasil: n/a   reports that she has never smoked. She has never used smokeless tobacco. She reports that she does not drink alcohol and does not use drugs. Son is local. She has  22 year old little girl that she cares for (thinks of her as her granddaughter), close family friend.   Past Medical History:  Diagnosis Date  . Anxiety   . Asthma   . Hypertension   . Osteoporosis 3/12   spine/nl hip  . Vitamin D deficiency     Past Surgical History:  Procedure Laterality Date  . CHOLECYSTECTOMY, LAPAROSCOPIC  2013  . TUBAL LIGATION Bilateral 1987  . WISDOM TOOTH EXTRACTION  age 78    Current Outpatient Medications  Medication Sig Dispense Refill  . albuterol (VENTOLIN HFA) 108 (90 Base) MCG/ACT inhaler INHALE 2 PUFFS INTO THE LUNGS EVERY 4 HOURS AS NEEDED FOR WHEEZING OR SHORTNESS OF BREATH    . atorvastatin (LIPITOR) 10 MG tablet Take 10 mg by mouth daily.  0  . Fluticasone-Salmeterol (ADVAIR) 100-50 MCG/DOSE AEPB Inhale 1 puff into the lungs 2 (two) times daily.    Marland Kitchen lisinopril-hydrochlorothiazide (PRINZIDE,ZESTORETIC) 20-12.5 MG per tablet Take 1 tablet by mouth daily.    . montelukast (SINGULAIR) 10 MG tablet TK 1 T PO QD    . Multiple Vitamin (MULTIVITAMIN) tablet Take 1 tablet by mouth daily.    . sertraline (ZOLOFT) 50 MG tablet  Take 50 mg by mouth daily.  0  . WIXELA INHUB 250-50 MCG/DOSE AEPB INHALE 1 PUFF INTO THE LUNGS TWICE DAILY     No current facility-administered medications for this visit.    Family History  Problem Relation Age of Onset  . Heart disease Mother   . Heart failure Mother   . Hypertension Mother   . Hyperlipidemia Mother   . Heart disease Father   . Lung cancer Brother   . Breast cancer Neg Hx     Review of Systems  Constitutional: Negative.   HENT: Negative.   Eyes: Negative.   Respiratory: Negative.   Cardiovascular: Negative.   Gastrointestinal: Negative.   Endocrine: Negative.   Genitourinary: Negative.   Musculoskeletal: Negative.   Skin: Negative.   Allergic/Immunologic: Negative.   Neurological: Negative.   Hematological: Negative.   Psychiatric/Behavioral: Negative.     Exam:   BP 118/78 (BP Location: Left Arm, Patient Position: Sitting, Cuff Size: Large)   Pulse 92   Ht 5\' 2"  (1.575 m)   Wt 196 lb (88.9 kg)   LMP 04/28/1988 (Approximate)   BMI 35.85 kg/m   Weight change: @WEIGHTCHANGE @ Height:   Height: 5\' 2"  (157.5 cm)  Ht Readings from Last 3 Encounters:  05/16/20 5\' 2"  (1.575 m)  10/25/18 5\' 2"  (1.575 m)  07/15/17 5' 2.25" (  1.581 m)    General appearance: alert, cooperative and appears stated age Head: Normocephalic, without obvious abnormality, atraumatic Neck: no adenopathy, supple, symmetrical, trachea midline and thyroid normal to inspection and palpation Breasts: normal appearance, no masses or tenderness, bilaterally inverted nipples (no change per patient) Abdomen: soft, non-tender; non distended,  no masses,  no organomegaly Extremities: extremities normal, atraumatic, no cyanosis or edema Skin: Skin color, texture, turgor normal. She has an erythematous rash in her umbilicus. Lymph nodes: Cervical, supraclavicular, and axillary nodes normal. No abnormal inguinal nodes palpated Neurologic: Grossly normal   Pelvic: External genitalia:  no  lesions              Urethra:  normal appearing urethra with no masses, tenderness or lesions              Bartholins and Skenes: normal                 Vagina: normal appearing vagina with normal color and discharge, no lesions              Cervix: no lesions               Bimanual Exam:  Uterus:  no masses or tenderness              Adnexa: no mass, fullness, tenderness               Rectovaginal: Confirms               Anus:  normal sphincter tone, no lesions  Joanna Preston chaperoned for the exam.  On questioning during the exam, the patient does report that her umbilicus is itchy.   1. Encounter for gynecological examination without abnormal finding Discussed breast self exam Discussed calcium and vit D intake No further paps needed Mammogram in 8/22 Cologuard with primary  2. History of osteopenia Getting calcium and vit D. Exercising F/U DEXA with her primary  3. Candidal intertrigo In her umbilicus.  - nystatin cream (MYCOSTATIN); Apply 1 application topically 2 (two) times daily. Apply to affected area BID for up to 7 days.  Dispense: 30 g; Refill: 0

## 2020-05-16 NOTE — Patient Instructions (Signed)

## 2020-11-20 ENCOUNTER — Other Ambulatory Visit: Payer: Self-pay | Admitting: Obstetrics & Gynecology

## 2020-11-20 DIAGNOSIS — Z1231 Encounter for screening mammogram for malignant neoplasm of breast: Secondary | ICD-10-CM

## 2020-12-29 LAB — EXTERNAL GENERIC LAB PROCEDURE: COLOGUARD: NEGATIVE

## 2020-12-29 LAB — COLOGUARD: COLOGUARD: NEGATIVE

## 2021-01-04 ENCOUNTER — Other Ambulatory Visit: Payer: Self-pay | Admitting: Physician Assistant

## 2021-01-04 DIAGNOSIS — E2839 Other primary ovarian failure: Secondary | ICD-10-CM

## 2021-01-09 ENCOUNTER — Other Ambulatory Visit: Payer: Self-pay

## 2021-01-09 ENCOUNTER — Ambulatory Visit
Admission: RE | Admit: 2021-01-09 | Discharge: 2021-01-09 | Disposition: A | Discharge status: Home / Self Care | Payer: Medicare HMO | Source: Ambulatory Visit | Attending: Obstetrics & Gynecology | Admitting: Obstetrics & Gynecology

## 2021-01-09 DIAGNOSIS — Z1231 Encounter for screening mammogram for malignant neoplasm of breast: Secondary | ICD-10-CM

## 2021-01-16 ENCOUNTER — Other Ambulatory Visit: Payer: Self-pay | Admitting: Obstetrics & Gynecology

## 2021-01-16 DIAGNOSIS — R928 Other abnormal and inconclusive findings on diagnostic imaging of breast: Secondary | ICD-10-CM

## 2021-02-05 ENCOUNTER — Other Ambulatory Visit: Payer: Self-pay

## 2021-02-05 ENCOUNTER — Ambulatory Visit
Admission: RE | Admit: 2021-02-05 | Discharge: 2021-02-05 | Disposition: A | Discharge status: Home / Self Care | Payer: Medicare HMO | Source: Ambulatory Visit | Attending: Obstetrics & Gynecology | Admitting: Obstetrics & Gynecology

## 2021-02-05 DIAGNOSIS — R928 Other abnormal and inconclusive findings on diagnostic imaging of breast: Secondary | ICD-10-CM

## 2021-06-22 ENCOUNTER — Emergency Department (HOSPITAL_COMMUNITY): Payer: Medicare HMO

## 2021-06-22 ENCOUNTER — Ambulatory Visit
Admission: EM | Admit: 2021-06-22 | Discharge: 2021-06-22 | Disposition: A | Discharge status: Other Acute Inpatient Hospital | Payer: Medicare HMO | Attending: Internal Medicine | Admitting: Internal Medicine

## 2021-06-22 ENCOUNTER — Observation Stay (HOSPITAL_COMMUNITY)
Admission: EM | Admit: 2021-06-22 | Discharge: 2021-06-23 | Disposition: A | Discharge status: Home / Self Care | Payer: Medicare HMO | Attending: Internal Medicine | Admitting: Internal Medicine

## 2021-06-22 ENCOUNTER — Encounter (HOSPITAL_COMMUNITY): Payer: Self-pay

## 2021-06-22 ENCOUNTER — Other Ambulatory Visit: Payer: Self-pay

## 2021-06-22 DIAGNOSIS — Z79899 Other long term (current) drug therapy: Secondary | ICD-10-CM | POA: Insufficient documentation

## 2021-06-22 DIAGNOSIS — E785 Hyperlipidemia, unspecified: Secondary | ICD-10-CM | POA: Diagnosis present

## 2021-06-22 DIAGNOSIS — I10 Essential (primary) hypertension: Secondary | ICD-10-CM | POA: Insufficient documentation

## 2021-06-22 DIAGNOSIS — E876 Hypokalemia: Secondary | ICD-10-CM

## 2021-06-22 DIAGNOSIS — J4521 Mild intermittent asthma with (acute) exacerbation: Secondary | ICD-10-CM

## 2021-06-22 DIAGNOSIS — J069 Acute upper respiratory infection, unspecified: Secondary | ICD-10-CM | POA: Diagnosis not present

## 2021-06-22 DIAGNOSIS — Z20822 Contact with and (suspected) exposure to covid-19: Secondary | ICD-10-CM | POA: Diagnosis not present

## 2021-06-22 DIAGNOSIS — J45901 Unspecified asthma with (acute) exacerbation: Secondary | ICD-10-CM | POA: Diagnosis present

## 2021-06-22 DIAGNOSIS — R0602 Shortness of breath: Secondary | ICD-10-CM

## 2021-06-22 DIAGNOSIS — J4541 Moderate persistent asthma with (acute) exacerbation: Principal | ICD-10-CM | POA: Insufficient documentation

## 2021-06-22 DIAGNOSIS — F419 Anxiety disorder, unspecified: Secondary | ICD-10-CM | POA: Diagnosis not present

## 2021-06-22 LAB — CBC WITH DIFFERENTIAL/PLATELET
Abs Immature Granulocytes: 0.09 10*3/uL — ABNORMAL HIGH (ref 0.00–0.07)
Basophils Absolute: 0.1 10*3/uL (ref 0.0–0.1)
Basophils Relative: 1 %
Eosinophils Absolute: 0.2 10*3/uL (ref 0.0–0.5)
Eosinophils Relative: 2 %
HCT: 39.4 % (ref 36.0–46.0)
Hemoglobin: 13.9 g/dL (ref 12.0–15.0)
Immature Granulocytes: 1 %
Lymphocytes Relative: 26 %
Lymphs Abs: 3 10*3/uL (ref 0.7–4.0)
MCH: 29.4 pg (ref 26.0–34.0)
MCHC: 35.3 g/dL (ref 30.0–36.0)
MCV: 83.3 fL (ref 80.0–100.0)
Monocytes Absolute: 0.8 10*3/uL (ref 0.1–1.0)
Monocytes Relative: 7 %
Neutro Abs: 7.3 10*3/uL (ref 1.7–7.7)
Neutrophils Relative %: 63 %
Platelets: 318 10*3/uL (ref 150–400)
RBC: 4.73 MIL/uL (ref 3.87–5.11)
RDW: 13 % (ref 11.5–15.5)
WBC: 11.5 10*3/uL — ABNORMAL HIGH (ref 4.0–10.5)
nRBC: 0 % (ref 0.0–0.2)

## 2021-06-22 LAB — RESP PANEL BY RT-PCR (FLU A&B, COVID) ARPGX2
Influenza A by PCR: NEGATIVE
Influenza B by PCR: NEGATIVE
SARS Coronavirus 2 by RT PCR: NEGATIVE

## 2021-06-22 LAB — BASIC METABOLIC PANEL
Anion gap: 12 (ref 5–15)
BUN: 15 mg/dL (ref 8–23)
CO2: 22 mmol/L (ref 22–32)
Calcium: 9.7 mg/dL (ref 8.9–10.3)
Chloride: 99 mmol/L (ref 98–111)
Creatinine, Ser: 0.95 mg/dL (ref 0.44–1.00)
GFR, Estimated: >60 mL/min (ref >60)
Glucose, Bld: 137 mg/dL — ABNORMAL HIGH (ref 70–99)
Potassium: 2.7 mmol/L — CL (ref 3.5–5.1)
Sodium: 133 mmol/L — ABNORMAL LOW (ref 135–145)

## 2021-06-22 LAB — PROCALCITONIN: Procalcitonin: <0.1 ng/mL

## 2021-06-22 LAB — PHOSPHORUS: Phosphorus: 2 mg/dL — ABNORMAL LOW (ref 2.5–4.6)

## 2021-06-22 LAB — MAGNESIUM: Magnesium: 1.9 mg/dL (ref 1.7–2.4)

## 2021-06-22 MED ORDER — ONDANSETRON HCL 4 MG/2ML IJ SOLN: 4.0000 mg | Freq: Four times a day (QID) | INTRAMUSCULAR | Status: DC | PRN

## 2021-06-22 MED ORDER — ALBUTEROL SULFATE (2.5 MG/3ML) 0.083% IN NEBU
2.5000 mg | INHALATION_SOLUTION | Freq: Once | RESPIRATORY_TRACT | Status: AC
  Administered 2021-06-22: 2.5 mg via RESPIRATORY_TRACT

## 2021-06-22 MED ORDER — SERTRALINE HCL 50 MG PO TABS: 50.0000 mg | ORAL_TABLET | Freq: Every day | ORAL | Status: DC

## 2021-06-22 MED ORDER — ATORVASTATIN CALCIUM 10 MG PO TABS
10.0000 mg | ORAL_TABLET | Freq: Every day | ORAL | Status: DC
  Administered 2021-06-22: 10 mg via ORAL
  Filled 2021-06-22: qty 1

## 2021-06-22 MED ORDER — POTASSIUM CHLORIDE CRYS ER 20 MEQ PO TBCR
40.0000 meq | EXTENDED_RELEASE_TABLET | Freq: Once | ORAL | Status: AC
  Administered 2021-06-22: 40 meq via ORAL
  Filled 2021-06-22: qty 2

## 2021-06-22 MED ORDER — PREDNISONE 20 MG PO TABS
40.0000 mg | ORAL_TABLET | Freq: Every day | ORAL | Status: DC
  Administered 2021-06-23: 40 mg via ORAL
  Filled 2021-06-22: qty 2

## 2021-06-22 MED ORDER — METOPROLOL TARTRATE 5 MG/5ML IV SOLN
5.0000 mg | Freq: Once | INTRAVENOUS | Status: AC
  Administered 2021-06-22: 5 mg via INTRAVENOUS
  Filled 2021-06-22: qty 5

## 2021-06-22 MED ORDER — LISINOPRIL-HYDROCHLOROTHIAZIDE 20-12.5 MG PO TABS: 1.0000 | ORAL_TABLET | Freq: Every day | ORAL | Status: DC

## 2021-06-22 MED ORDER — MONTELUKAST SODIUM 10 MG PO TABS
10.0000 mg | ORAL_TABLET | Freq: Every day | ORAL | Status: DC
  Administered 2021-06-22: 10 mg via ORAL
  Filled 2021-06-22: qty 1

## 2021-06-22 MED ORDER — LISINOPRIL 20 MG PO TABS
20.0000 mg | ORAL_TABLET | Freq: Every day | ORAL | Status: DC
  Administered 2021-06-22 – 2021-06-23 (×2): 20 mg via ORAL
  Filled 2021-06-22 (×2): qty 1

## 2021-06-22 MED ORDER — ONDANSETRON HCL 4 MG PO TABS
4.0000 mg | ORAL_TABLET | Freq: Four times a day (QID) | ORAL | Status: DC | PRN
Start: 2021-06-22 — End: 2021-06-23

## 2021-06-22 MED ORDER — ACETAMINOPHEN 650 MG RE SUPP: 650.0000 mg | Freq: Four times a day (QID) | RECTAL | Status: DC | PRN

## 2021-06-22 MED ORDER — ACETAMINOPHEN 325 MG PO TABS
650.0000 mg | ORAL_TABLET | Freq: Four times a day (QID) | ORAL | Status: DC | PRN
  Administered 2021-06-23: 650 mg via ORAL
  Filled 2021-06-22: qty 2

## 2021-06-22 MED ORDER — POTASSIUM CHLORIDE CRYS ER 20 MEQ PO TBCR: 40.0000 meq | EXTENDED_RELEASE_TABLET | Freq: Every evening | ORAL | Status: DC

## 2021-06-22 MED ORDER — ALBUTEROL SULFATE (2.5 MG/3ML) 0.083% IN NEBU: 2.5000 mg | INHALATION_SOLUTION | RESPIRATORY_TRACT | Status: DC | PRN

## 2021-06-22 MED ORDER — POTASSIUM CHLORIDE CRYS ER 20 MEQ PO TBCR
20.0000 meq | EXTENDED_RELEASE_TABLET | Freq: Every evening | ORAL | Status: AC
  Administered 2021-06-22: 20 meq via ORAL
  Filled 2021-06-22: qty 1

## 2021-06-22 MED ORDER — METHYLPREDNISOLONE SODIUM SUCC 40 MG IJ SOLR
40.0000 mg | Freq: Two times a day (BID) | INTRAMUSCULAR | Status: AC
  Administered 2021-06-22: 40 mg via INTRAVENOUS
  Filled 2021-06-22: qty 1

## 2021-06-22 MED ORDER — ALBUTEROL SULFATE (2.5 MG/3ML) 0.083% IN NEBU
5.0000 mg | INHALATION_SOLUTION | Freq: Once | RESPIRATORY_TRACT | Status: AC
  Administered 2021-06-22: 5 mg via RESPIRATORY_TRACT
  Filled 2021-06-22: qty 6

## 2021-06-22 MED ORDER — LACTATED RINGERS IV BOLUS
1000.0000 mL | Freq: Once | INTRAVENOUS | Status: AC
  Administered 2021-06-22: 1000 mL via INTRAVENOUS

## 2021-06-22 MED ORDER — IPRATROPIUM BROMIDE 0.02 % IN SOLN
0.5000 mg | Freq: Four times a day (QID) | RESPIRATORY_TRACT | Status: DC
  Administered 2021-06-22 – 2021-06-23 (×3): 0.5 mg via RESPIRATORY_TRACT
  Filled 2021-06-22 (×3): qty 2.5

## 2021-06-22 MED ORDER — METHYLPREDNISOLONE SODIUM SUCC 125 MG IJ SOLR
125.0000 mg | Freq: Once | INTRAMUSCULAR | Status: AC
  Administered 2021-06-22: 125 mg via INTRAVENOUS
  Filled 2021-06-22: qty 2

## 2021-06-22 MED ORDER — IPRATROPIUM-ALBUTEROL 0.5-2.5 (3) MG/3ML IN SOLN
3.0000 mL | Freq: Three times a day (TID) | RESPIRATORY_TRACT | Status: DC
  Administered 2021-06-22: 3 mL via RESPIRATORY_TRACT
  Filled 2021-06-22: qty 3

## 2021-06-22 MED ORDER — IPRATROPIUM BROMIDE 0.02 % IN SOLN
0.5000 mg | Freq: Once | RESPIRATORY_TRACT | Status: AC
  Administered 2021-06-22: 0.5 mg via RESPIRATORY_TRACT
  Filled 2021-06-22: qty 2.5

## 2021-06-22 MED ORDER — ALBUTEROL SULFATE (2.5 MG/3ML) 0.083% IN NEBU
INHALATION_SOLUTION | RESPIRATORY_TRACT | Status: AC
  Filled 2021-06-22: qty 3

## 2021-06-22 MED ORDER — K PHOS MONO-SOD PHOS DI & MONO 155-852-130 MG PO TABS
500.0000 mg | ORAL_TABLET | Freq: Two times a day (BID) | ORAL | Status: AC
  Administered 2021-06-22 (×2): 500 mg via ORAL
  Filled 2021-06-22 (×3): qty 2

## 2021-06-22 MED ORDER — HYDROCHLOROTHIAZIDE 12.5 MG PO TABS
12.5000 mg | ORAL_TABLET | Freq: Every day | ORAL | Status: DC
Start: 2021-06-22 — End: 2021-06-23
  Administered 2021-06-22 – 2021-06-23 (×2): 12.5 mg via ORAL
  Filled 2021-06-22 (×2): qty 1

## 2021-06-22 MED ORDER — SERTRALINE HCL 25 MG PO TABS
25.0000 mg | ORAL_TABLET | Freq: Every day | ORAL | Status: DC
  Administered 2021-06-22: 25 mg via ORAL
  Filled 2021-06-22: qty 1

## 2021-06-22 MED ORDER — POTASSIUM CHLORIDE CRYS ER 20 MEQ PO TBCR
20.0000 meq | EXTENDED_RELEASE_TABLET | Freq: Every day | ORAL | Status: DC
  Administered 2021-06-23: 20 meq via ORAL
  Filled 2021-06-22: qty 1

## 2021-06-22 MED ORDER — MAGNESIUM SULFATE 2 GM/50ML IV SOLN
2.0000 g | Freq: Once | INTRAVENOUS | Status: AC
  Administered 2021-06-22: 2 g via INTRAVENOUS
  Filled 2021-06-22: qty 50

## 2021-06-22 MED ORDER — LEVALBUTEROL HCL 1.25 MG/0.5ML IN NEBU
1.2500 mg | INHALATION_SOLUTION | Freq: Four times a day (QID) | RESPIRATORY_TRACT | Status: DC
  Administered 2021-06-22 – 2021-06-23 (×3): 1.25 mg via RESPIRATORY_TRACT
  Filled 2021-06-22 (×3): qty 0.5

## 2021-06-22 MED ORDER — ENOXAPARIN SODIUM 40 MG/0.4ML IJ SOSY
40.0000 mg | PREFILLED_SYRINGE | INTRAMUSCULAR | Status: DC
  Administered 2021-06-22: 40 mg via SUBCUTANEOUS
  Filled 2021-06-22: qty 0.4

## 2021-06-22 MED ORDER — POTASSIUM CHLORIDE 10 MEQ/100ML IV SOLN
10.0000 meq | INTRAVENOUS | Status: AC
  Administered 2021-06-22 (×2): 10 meq via INTRAVENOUS
  Filled 2021-06-22 (×2): qty 100

## 2021-06-22 NOTE — ED Provider Notes (Signed)
[facility]    CSN: [id number] Arrival date & time: 06/22/21  0804      History   Chief Complaint Chief Complaint  Patient presents with   Cough    HPI Marvis Bakken is a 71 y.o. female.   Patient presents with nasal congestion, cough, sore throat, shortness of breath that started approximately 4 days ago.  She denies any known fevers or sick contacts.  She reports that she does have a history of asthma and has been dealing with shortness of breath.  Has been taking her albuterol inhaler with no improvement.  Last time she took her inhaler was yesterday.  Denies chest pain, ear pain, nausea, vomiting, diarrhea, abdominal pain.   Cough  Past Medical History:  Diagnosis Date   Anxiety    Asthma    Hypertension    Osteoporosis 3/12   spine/nl hip   Vitamin D deficiency     Patient Active Problem List   Diagnosis Date Noted   Severe obesity (BMI 35.0-39.9) with comorbidity (HCC) 07/27/2018   Anxiety state, unspecified 06/27/2013   HTN (hypertension) 06/27/2013   Osteoporosis 06/27/2013   Unspecified vitamin D deficiency 06/27/2013   Asthma 05/27/2012   Hyperlipemia 05/29/2011    Past Surgical History:  Procedure Laterality Date   CHOLECYSTECTOMY, LAPAROSCOPIC  2013   TUBAL LIGATION Bilateral 1987   WISDOM TOOTH EXTRACTION  age 70    OB History     Gravida  2   Para  2   Term  1   Preterm  1   AB  0   Living  1      SAB  0   IAB  0   Ectopic  0   Multiple  0   Live Births  1            Home Medications    Prior to Admission medications   Medication Sig Start Date End Date Taking? Authorizing Provider  albuterol (VENTOLIN HFA) 108 (90 Base) MCG/ACT inhaler INHALE 2 PUFFS INTO THE LUNGS EVERY 4 HOURS AS NEEDED FOR WHEEZING OR SHORTNESS OF BREATH 10/07/18   [provider]  atorvastatin (LIPITOR) 10 MG tablet Take 10 mg by mouth daily. 05/29/14   [provider]  Fluticasone-Salmeterol (ADVAIR) 100-50  MCG/DOSE AEPB Inhale 1 puff into the lungs 2 (two) times daily.    [provider]  lisinopril-hydrochlorothiazide (PRINZIDE,ZESTORETIC) 20-12.5 MG per tablet Take 1 tablet by mouth daily.    [provider]  montelukast (SINGULAIR) 10 MG tablet TK 1 T PO QD 10/06/18   [provider]  Multiple Vitamin (MULTIVITAMIN) tablet Take 1 tablet by mouth daily.    [provider]  nystatin cream (MYCOSTATIN) Apply 1 application topically 2 (two) times daily. Apply to affected area BID for up to 7 days. 05/16/20   Romualdo Bolk, MD  sertraline (ZOLOFT) 50 MG tablet Take 50 mg by mouth daily. 04/10/14   [provider]  Monte Fantasia INHUB 250-50 MCG/DOSE AEPB INHALE 1 PUFF INTO THE LUNGS TWICE DAILY 10/07/18   [provider]    Family History Family History  Problem Relation Age of Onset   Heart disease Mother    Heart failure Mother    Hypertension Mother    Hyperlipidemia Mother    Heart disease Father    Lung cancer Brother    Breast cancer Neg Hx     Social History Social History   Tobacco Use   Smoking status:  Never   Smokeless tobacco: Never  Vaping Use   Vaping Use: Never used  Substance Use Topics   Alcohol use: No   Drug use: No     Allergies   Ethanol, Shellfish allergy, Influenza vaccines, and Codeine   Review of Systems Review of Systems Per HPI  Physical Exam Triage Vital Signs ED Triage Vitals  Enc Vitals Group     BP 06/22/21 0825 (!) 141/80     Pulse Rate 06/22/21 0825 93     Resp 06/22/21 0825 18     Temp 06/22/21 0825 98.5 F (36.9 C)     Temp Source 06/22/21 0825 Oral     SpO2 06/22/21 0825 95 %     Weight --      Height --      Head Circumference --      Peak Flow --      Pain Score 06/22/21 0826 0     Pain Loc --      Pain Edu? --      Excl. in GC? --    No data found.  Updated Vital Signs BP (!) 141/80 (BP Location: Left Arm)    Pulse 93    Temp 98.5 F (36.9 C) (Oral)    Resp 18    LMP  04/28/1988 (Approximate)    SpO2 95%   Visual Acuity Right Eye Distance:   Left Eye Distance:   Bilateral Distance:    Right Eye Near:   Left Eye Near:    Bilateral Near:     Physical Exam Constitutional:      General: She is not in acute distress.    Appearance: Normal appearance. She is not toxic-appearing or diaphoretic.  HENT:     Head: Normocephalic and atraumatic.     Right Ear: Tympanic membrane and ear canal normal.     Left Ear: Tympanic membrane and ear canal normal.     Nose: Congestion present.     Mouth/Throat:     Mouth: Mucous membranes are moist.     Pharynx: No posterior oropharyngeal erythema.  Eyes:     Extraocular Movements: Extraocular movements intact.     Conjunctiva/sclera: Conjunctivae normal.     Pupils: Pupils are equal, round, and reactive to light.  Cardiovascular:     Rate and Rhythm: Normal rate and regular rhythm.     Pulses: Normal pulses.     Heart sounds: Normal heart sounds.  Pulmonary:     Effort: No respiratory distress.     Breath sounds: Normal breath sounds. No stridor. No wheezing, rhonchi or rales.     Comments: Diminished breath sounds bilaterally.  Tachypnea. Abdominal:     General: Abdomen is flat. Bowel sounds are normal.     Palpations: Abdomen is soft.  Musculoskeletal:        General: Normal range of motion.     Cervical back: Normal range of motion.  Skin:    General: Skin is warm and dry.  Neurological:     General: No focal deficit present.     Mental Status: She is alert and oriented to person, place, and time. Mental status is at baseline.  Psychiatric:        Mood and Affect: Mood normal.        Behavior: Behavior normal.     UC Treatments / Results  Labs (all labs ordered are listed, but only abnormal results are displayed) Labs Reviewed - No data to display  EKG  Radiology No results found.  Procedures Procedures (including critical care time)  Medications Ordered in UC Medications   albuterol (PROVENTIL) (2.5 MG/3ML) 0.083% nebulizer solution 2.5 mg (2.5 mg Nebulization Given 06/22/21 0838)    Initial Impression / Assessment and Plan / UC Course  I have reviewed the triage vital signs and the nursing notes.  Pertinent labs & imaging results that were available during my care of the patient were reviewed by me and considered in my medical decision making (see chart for details).     Patient has tachypnea.  No obvious wheezing on auscultation.Patient's lung sounds sounded very diminished and tight.  Albuterol nebulizer solution was administered in urgent care to see if any improvement would be made.  On second physical exam, patient's lung sounds were the same and she was still tachypneic.  Advised patient that she will need to go to the hospital for further evaluation and management given lung sounds and physical exam.  Concerned that patient is not moving much air.  She was agreeable with plan.  Patient left via EMS. Final Clinical Impressions(s) / UC Diagnoses   Final diagnoses:  Shortness of breath     Discharge Instructions      Patient sent to hospital via EMS.     ED Prescriptions   None    PDMP not reviewed this encounter.   Gustavus Bryant, [place] 06/22/21 [phone]

## 2021-06-22 NOTE — ED Provider Notes (Signed)
[facility] DEPT Provider Note   CSN: [id number] Arrival date & time: 06/22/21  1008     History  Chief Complaint  Patient presents with   Shortness of Breath    Joanna Preston is a 71 y.o. female.  Patient is a 71 year old female with a history of asthma, hypertension and hyperlipidemia who is presenting today from urgent care with EMS due to shortness of breath.  Patient reports on Wednesday she started having nasal congestion, sinus pressure and some mild cough.  This has progressed over the last 4 days and she is started feeling more more short of breath with her asthma.  She has been using Mucinex and her inhaler at home but when she woke up this morning reported she just could not catch her breath.  She initially went to urgent care and at that time they gave her albuterol but because of her symptoms they called EMS and sent her here.  There was no report of her having hypoxia upon arrival there.  Patient is still complaining of shortness of breath and reported some improvement with the DuoNeb and the albuterol she received prior to arrival.  She denies any chest pain, abdominal pain, nausea, vomiting but has had decreased oral intake.  No notable fevers.  The history is provided by the patient and medical records.  Shortness of Breath     Home Medications Prior to Admission medications   Medication Sig Start Date End Date Taking? Authorizing Provider  albuterol (VENTOLIN HFA) 108 (90 Base) MCG/ACT inhaler INHALE 2 PUFFS INTO THE LUNGS EVERY 4 HOURS AS NEEDED FOR WHEEZING OR SHORTNESS OF BREATH 10/07/18  Yes [provider]  atorvastatin (LIPITOR) 10 MG tablet Take 10 mg by mouth at bedtime. 05/29/14  Yes [provider]  fluticasone-salmeterol (ADVAIR) 500-50 MCG/ACT AEPB Inhale 1 puff into the lungs in the morning and at bedtime.   Yes [provider]  lisinopril-hydrochlorothiazide (PRINZIDE,ZESTORETIC) 20-12.5 MG per  tablet Take 1 tablet by mouth at bedtime.   Yes [provider]  montelukast (SINGULAIR) 10 MG tablet at bedtime. 10/06/18  Yes [provider]  Multiple Vitamin (MULTIVITAMIN) tablet Take 1 tablet by mouth daily.   Yes [provider]  sertraline (ZOLOFT) 50 MG tablet Take 50 mg by mouth at bedtime. 04/10/14  Yes [provider]  EPINEPHrine 0.3 mg/0.3 mL IJ SOAJ injection Use as directed if administer call 911 01/03/21   [provider]  nystatin cream (MYCOSTATIN) Apply 1 application topically 2 (two) times daily. Apply to affected area BID for up to 7 days. 05/16/20   Salvadore Dom, MD      Allergies    Ethanol, Shellfish allergy, Influenza vaccines, and Codeine    Review of Systems   Review of Systems  Respiratory:  Positive for shortness of breath.    Physical Exam Updated Vital Signs BP (!) 141/80    Pulse 91    Temp 98.1 F (36.7 C)    Resp 20    LMP 04/28/1988 (Approximate)    SpO2 99%  Physical Exam Vitals and nursing note reviewed.  Constitutional:      General: She is not in acute distress.    Appearance: She is well-developed.  HENT:     Head: Normocephalic and atraumatic.  Eyes:     Conjunctiva/sclera: Conjunctivae normal.     Pupils: Pupils are equal, round, and reactive to light.  Cardiovascular:     Rate and Rhythm: Regular rhythm.  Tachycardia present.     Heart sounds: No murmur heard. Pulmonary:     Effort: Tachypnea and accessory muscle usage present. No respiratory distress.     Breath sounds: Decreased breath sounds and wheezing present. No rales.  Abdominal:     General: There is no distension.     Palpations: Abdomen is soft.     Tenderness: There is no abdominal tenderness. There is no guarding or rebound.  Musculoskeletal:        General: No tenderness. Normal range of motion.     Cervical back: Normal range of motion and neck supple.     Right lower leg: No edema.     Left lower leg: No edema.   Skin:    General: Skin is warm and dry.     Findings: No erythema or rash.  Neurological:     Mental Status: She is alert and oriented to person, place, and time. Mental status is at baseline.  Psychiatric:        Mood and Affect: Mood normal.        Behavior: Behavior normal.    ED Results / Procedures / Treatments   Labs (all labs ordered are listed, but only abnormal results are displayed) Labs Reviewed  CBC WITH DIFFERENTIAL/PLATELET - Abnormal; Notable for the following components:      Result Value   WBC 11.5 (*)    Abs Immature Granulocytes 0.09 (*)    All other components within normal limits  BASIC METABOLIC PANEL - Abnormal; Notable for the following components:   Sodium 133 (*)    Potassium 2.7 (*)    Glucose, Bld 137 (*)    All other components within normal limits  PHOSPHORUS - Abnormal; Notable for the following components:   Phosphorus 2.0 (*)    All other components within normal limits  RESP PANEL BY RT-PCR (FLU A&B, COVID) ARPGX2  MAGNESIUM    EKG EKG Interpretation  Date/Time:  Saturday June 22 2021 10:18:58 EST Ventricular Rate:  89 PR Interval:  184 QRS Duration: 134 QT Interval:  384 QTC Calculation: 468 R Axis:   -46 Text Interpretation: Sinus rhythm RBBB and LAFB Minimal ST elevation, lateral leads No previous tracing Confirmed by Blanchie Dessert [phone]) on 06/22/2021 11:19:42 AM  Radiology DG Chest Port 1 View  Result Date: 06/22/2021 CLINICAL DATA:  Shortness of breath, cough EXAM: PORTABLE CHEST 1 VIEW COMPARISON:  None. FINDINGS: The heart size and mediastinal contours are within normal limits. Both lungs are clear. The visualized skeletal structures are unremarkable. IMPRESSION: No active disease. Electronically Signed   By: Elmer Picker M.D.   On: 06/22/2021 10:49    Procedures Procedures    Medications Ordered in ED Medications  potassium chloride 10 mEq in 100 mL IVPB (10 mEq Intravenous New Bag/Given 06/22/21 1322)   potassium chloride SA (KLOR-CON M) CR tablet 40 mEq (has no administration in time range)  methylPREDNISolone sodium succinate (SOLU-MEDROL) 40 mg/mL injection 40 mg (has no administration in time range)    Followed by  predniSONE (DELTASONE) tablet 40 mg (has no administration in time range)  enoxaparin (LOVENOX) injection 40 mg (has no administration in time range)  acetaminophen (TYLENOL) tablet 650 mg (has no administration in time range)    Or  acetaminophen (TYLENOL) suppository 650 mg (has no administration in time range)  ondansetron (ZOFRAN) tablet 4 mg (has no administration in time range)    Or  ondansetron (ZOFRAN) injection 4 mg (has no administration in time  range)  albuterol (PROVENTIL) (2.5 MG/3ML) 0.083% nebulizer solution 2.5 mg (has no administration in time range)  ipratropium-albuterol (DUONEB) 0.5-2.5 (3) MG/3ML nebulizer solution 3 mL (has no administration in time range)  potassium chloride SA (KLOR-CON M) CR tablet 40 mEq (has no administration in time range)  lisinopril-hydrochlorothiazide (ZESTORETIC) 20-12.5 MG per tablet 1 tablet (has no administration in time range)  sertraline (ZOLOFT) tablet 50 mg (has no administration in time range)  atorvastatin (LIPITOR) tablet 10 mg (has no administration in time range)  montelukast (SINGULAIR) tablet 10 mg (has no administration in time range)  potassium chloride SA (KLOR-CON M) CR tablet 20 mEq (has no administration in time range)  lactated ringers bolus 1,000 mL (0 mLs Intravenous Stopped 06/22/21 1231)  methylPREDNISolone sodium succinate (SOLU-MEDROL) 125 mg/2 mL injection 125 mg (125 mg Intravenous Given 06/22/21 1049)  magnesium sulfate IVPB 2 g 50 mL (0 g Intravenous Stopped 06/22/21 1148)  albuterol (PROVENTIL) (2.5 MG/3ML) 0.083% nebulizer solution 5 mg (5 mg Nebulization Not Given 06/22/21 1100)  ipratropium (ATROVENT) nebulizer solution 0.5 mg (0.5 mg Nebulization Given 06/22/21 1045)    ED Course/ Medical  Decision Making/ A&P                           Medical Decision Making Amount and/or Complexity of Data Reviewed Independent Historian: EMS External Data Reviewed: notes. Labs: ordered. Decision-making details documented in ED Course. Radiology: ordered and independent interpretation performed. Decision-making details documented in ED Course. ECG/medicine tests: ordered and independent interpretation performed. Decision-making details documented in ED Course.  Risk Prescription drug management. Decision regarding hospitalization.   Patient is a 72 year old female presenting today with symptoms most concerning for asthma exacerbation in the setting of URI symptoms earlier this week.  Patient is still having accessory muscle use and increased work of breathing with decreased breath sounds in the upper lobes.  Sats are currently 95 to 100% on room air.  Patient has no focal crackles or rhonchi on respiratory exam.  She is mentating normally and afebrile.  No indication for sepsis at this time.  Low suspicion for ACS or PE.  I independently interpreted patient's EKG and her labs.  Her EKG does show right bundle branch block without old to compare, COVID and flu are negative, CBC with minimal leukocytosis of 11 and stable hemoglobin and platelet count, BMP with hypokalemia today of 2.7 normal renal function and sodium.  Hypokalemia is most likely a result of albuterol use.  Magnesium was within normal limits and patient's phosphorus was low at 2.0.  I independently reviewed and interpreted patient's chest x-ray which is normal.  On repeat evaluation after patient received Solu-Medrol, magnesium, albuterol and Atrovent she is still complaining of feeling short of breath and is still using some accessory muscles with some minimal wheezing which appears improved but not back to her baseline.  Suspect asthma exacerbation related to recent URI.  Feel that patient meets admission criteria at this time.   Findings discussed with the patient and her husband who are comfortable with this plan.  Prior external medical records from urgent care were reviewed from the visit prior to arrival today.  Potassium was replaced by IV runs.  CRITICAL CARE Performed by: Aarin Bluett Total critical care time: 30 minutes Critical care time was exclusive of separately billable procedures and treating other patients. Critical care was necessary to treat or prevent imminent or life-threatening deterioration. Critical care was time spent personally by  me on the following activities: development of treatment plan with patient and/or surrogate as well as nursing, discussions with consultants, evaluation of patient's response to treatment, examination of patient, obtaining history from patient or surrogate, ordering and performing treatments and interventions, ordering and review of laboratory studies, ordering and review of radiographic studies, pulse oximetry and re-evaluation of patient's condition.         Final Clinical Impression(s) / ED Diagnoses Final diagnoses:  Moderate persistent asthma with exacerbation  Viral upper respiratory tract infection    Rx / DC Orders ED Discharge Orders     None         Blanchie Dessert, MD 06/22/21 1337

## 2021-06-22 NOTE — ED Notes (Signed)
Ems at bedside  

## 2021-06-22 NOTE — Plan of Care (Signed)
New admission 1512, Asthma Exacerbation  IV steriods, Breathing Tx, and supplemental oxygen per orders. Telemetry ordered, sinus tachycardia.   Problem: Education: Goal: Knowledge of disease or condition will improve Outcome: Progressing Goal: Knowledge of the prescribed therapeutic regimen will improve Outcome: Progressing Goal: Individualized Educational Video(s) Outcome: Progressing   Problem: Activity: Goal: Ability to tolerate increased activity will improve Outcome: Progressing Goal: Will verbalize the importance of balancing activity with adequate rest periods Outcome: Progressing   Problem: Respiratory: Goal: Ability to maintain a clear airway will improve Outcome: Progressing Goal: Levels of oxygenation will improve Outcome: Progressing Goal: Ability to maintain adequate ventilation will improve Outcome: Progressing   Problem: Education: Goal: Knowledge of General Education information will improve Description: Including pain rating scale, medication(s)/side effects and non-pharmacologic comfort measures Outcome: Progressing   Problem: Health Behavior/Discharge Planning: Goal: Ability to manage health-related needs will improve Outcome: Progressing   Problem: Clinical Measurements: Goal: Ability to maintain clinical measurements within normal limits will improve Outcome: Progressing Goal: Will remain free from infection Outcome: Progressing Goal: Diagnostic test results will improve Outcome: Progressing Goal: Respiratory complications will improve Outcome: Progressing Goal: Cardiovascular complication will be avoided Outcome: Progressing   Problem: Activity: Goal: Risk for activity intolerance will decrease Outcome: Progressing   Problem: Nutrition: Goal: Adequate nutrition will be maintained Outcome: Progressing   Problem: Coping: Goal: Level of anxiety will decrease Outcome: Progressing   Problem: Elimination: Goal: Will not experience  complications related to bowel motility Outcome: Progressing Goal: Will not experience complications related to urinary retention Outcome: Progressing   Problem: Pain Managment: Goal: General experience of comfort will improve Outcome: Progressing   Problem: Safety: Goal: Ability to remain free from injury will improve Outcome: Progressing   Problem: Skin Integrity: Goal: Risk for impaired skin integrity will decrease Outcome: Progressing

## 2021-06-22 NOTE — Progress Notes (Signed)
°   06/22/21 1840  Assess: MEWS Score  BP (!) 158/80  Pulse Rate (!) 116  Resp 20  SpO2 98 %  O2 Device Nasal Cannula  O2 Flow Rate (L/min) 2 L/min  Assess: MEWS Score  MEWS Temp 0  MEWS Systolic 0  MEWS Pulse 2  MEWS RR 0  MEWS LOC 0  MEWS Score 2  MEWS Score Color Yellow  Assess: if the MEWS score is Yellow or Red  Were vital signs taken at a resting state? Yes  Focused Assessment Change from prior assessment (see assessment flowsheet)  Does the patient meet 2 or more of the SIRS criteria? No  MEWS guidelines implemented *See Row Information* No, vital signs rechecked  Treat  Pain Scale 0-10  Pain Score 0  Take Vital Signs  Increase Vital Sign Frequency  Yellow: Q 2hr X 2 then Q 4hr X 2, if remains yellow, continue Q 4hrs  Escalate  MEWS: Escalate Yellow: discuss with charge nurse/RN and consider discussing with provider and RRT  Notify: Charge Nurse/RN  Name of Charge Nurse/RN Notified Toniann Fail RN  Date Charge Nurse/RN Notified 06/22/21  Time Charge Nurse/RN Notified 1843  Notify: Provider  Provider Name/Title Dr. Robb Matar  Date Provider Notified 06/22/21  Time Provider Notified 1844  Notification Type Page  Notification Reason Change in status  Provider response Other (Comment) (Waiting for MD response)  Date of Provider Response 06/22/21  Notify: Rapid Response  Name of Rapid Response RN Notified no  Document  Patient Outcome Other (Comment)  Progress note created (see row info) Yes  Assess: SIRS CRITERIA  SIRS Temperature  0  SIRS Pulse 1  SIRS Respirations  0  SIRS WBC 0  SIRS Score Sum  1

## 2021-06-22 NOTE — ED Triage Notes (Signed)
Pt c/o cough, sore throat, nasal congestion,   Denies ear ache, headache, nausea, vomiting, diarrhea, constipation   Onset ~ Wednesday   Audible wheezing in triage. States last used albuterol mdi yesterday.

## 2021-06-22 NOTE — Progress Notes (Signed)
[facility] admitting physician addendum:  The nursing staff reported that the patient's heart rate has been in the 110s and 120s.  DuoNeb was switched to Xopenex plus ipratropium nebs.  Metoprolol 5 mg IVP x1 dose was ordered.  Sanda Klein, MD.

## 2021-06-22 NOTE — ED Triage Notes (Signed)
Pt arrived via EMS, from urgent care. Pt c/o SOB, productive cough (green mucus), nasal congestion x4 days. Denies any CP, n/v. Denies any known sick contacts.   Given 2.5 albuterol at UC Given duoneb x1 by EMS

## 2021-06-22 NOTE — Discharge Instructions (Signed)
Patient sent to hospital via EMS.  

## 2021-06-22 NOTE — H&P (Signed)
History and Physical    Patient: Joanna Preston [id number] DOB: 07/08/1950 DOA: 06/22/2021 DOS: the patient was seen and examined on 06/22/2021 PCP: Aura Dials, PA-C  Patient coming from: Home  Chief Complaint:  Chief Complaint  Patient presents with   Shortness of Breath    HPI: Joanna Preston is a 71 y.o. 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  She had trouble sleeping last night due to dyspnea.  She came to the emergency department.  No fever, chills, night sweats, rhinorrhea, sore throat or hemoptysis.  Denied chest pain, palpitations, diaphoresis, PND, orthopnea or pitting edema of the lower extremities.  No abdominal pain, nausea, emesis, diarrhea, constipation, melena or hematochezia.  No flank pain, dysuria, frequency or hematuria.  No polyuria, polydipsia, polyphagia or blurred vision.  ED course: Initial vital signs were temperature 98.1 F, pulse 79, respirations 37, O2 sat 100% on room air.  Most recent vital signs are pulse 98, RR 19, BP 137/87 mmHg O2 sat 100% on room air.  However, the patient gets dyspneic and tachypneic with regular conversation.  I placed her on nasal cannula oxygen at 2 LPM.  She received Solu-Medrol 125 mg IVPB, magnesium sulfate 2 g IVPB and methylprednisolone 125 mg IVP.  Lab work: CBC showed a white count 11.5 with 63% neutrophils, 26% lymphocytes and 7% monocytes.  Sodium 133 and potassium 2.7 mmol/L.  Nonfasting glucose 137 mg/dL.  The rest of the electrolytes and renal function were normal.  Magnesium was 1.9 and phosphorus 2.0 mg/dL.  Imaging: A one-view portable chest radiograph showed  normal heart size and mediastinal contours.  Both lungs were clear.  Skeletal structures were unremarkable.  Review of Systems: As mentioned in the history of present illness. All other systems reviewed and are negative. Past Medical History:  Diagnosis Date   Anxiety    Asthma    Hyperlipemia 05/29/2011   Hypertension    Osteoporosis 3/12   spine/nl hip   Vitamin D deficiency    Past Surgical History:  Procedure Laterality Date   CHOLECYSTECTOMY, LAPAROSCOPIC  2013   TUBAL LIGATION Bilateral 1987   WISDOM TOOTH EXTRACTION  age 63   Social History:  reports that she has never smoked. She has never used smokeless tobacco. She reports that she does not drink alcohol and does not use drugs.  Allergies  Allergen Reactions   Ethanol Anaphylaxis   Shellfish Allergy Anaphylaxis   Influenza Vaccines Hives   Codeine Nausea And Vomiting    Family History  Problem Relation Age of Onset   Heart disease Mother    Heart failure Mother    Hypertension Mother    Hyperlipidemia Mother    Heart disease Father    Lung cancer Brother    Breast cancer Neg Hx     Prior to Admission medications   Medication Sig Start Date End Date Taking? Authorizing Provider  albuterol (VENTOLIN HFA) 108 (90 Base) MCG/ACT inhaler INHALE 2 PUFFS INTO THE LUNGS EVERY 4 HOURS AS NEEDED FOR WHEEZING OR SHORTNESS OF BREATH 10/07/18   [provider]  atorvastatin (LIPITOR) 10 MG tablet Take 10 mg by mouth daily. 05/29/14   [provider]  Fluticasone-Salmeterol (ADVAIR) 100-50 MCG/DOSE AEPB Inhale 1  puff into the lungs 2 (two) times daily.    [provider]  lisinopril-hydrochlorothiazide (PRINZIDE,ZESTORETIC) 20-12.5 MG per tablet Take 1 tablet by mouth daily.    [provider]  montelukast (SINGULAIR) 10 MG tablet TK 1 T PO QD 10/06/18   [provider]  Multiple Vitamin (MULTIVITAMIN) tablet Take 1 tablet by mouth daily.    [provider]  nystatin cream  (MYCOSTATIN) Apply 1 application topically 2 (two) times daily. Apply to affected area BID for up to 7 days. 05/16/20   Salvadore Dom, MD  sertraline (ZOLOFT) 50 MG tablet Take 50 mg by mouth daily. 04/10/14   [provider]  Grant Ruts INHUB 250-50 MCG/DOSE AEPB INHALE 1 PUFF INTO THE LUNGS TWICE DAILY 10/07/18   [provider]    Physical Exam: Vitals:   06/22/21 1200 06/22/21 1215 06/22/21 1230 06/22/21 1245  BP: (!) 141/115 (!) 147/75 (!) 133/118 (!) 141/80  Pulse: 90 90 93 91  Resp: (!) 23 (!) 21 (!) 23 20  Temp:      SpO2: 100% 100% 100% 99%   Physical Exam Constitutional:      Appearance: She is obese.  HENT:     Head: Normocephalic.  Eyes:     Pupils: Pupils are equal, round, and reactive to light.  Neck:     Vascular: No JVD.  Cardiovascular:     Rate and Rhythm: Normal rate and regular rhythm.  Pulmonary:     Effort: Tachypnea present.     Breath sounds: Decreased breath sounds, wheezing and rhonchi present.  Abdominal:     General: Bowel sounds are normal.     Palpations: Abdomen is soft.     Tenderness: There is no abdominal tenderness.  Musculoskeletal:     Cervical back: Neck supple.     Right lower leg: No edema.     Left lower leg: No edema.  Skin:    General: Skin is warm and dry.  Neurological:     General: No focal deficit present.     Mental Status: She is alert and oriented to person, place, and time.  Psychiatric:        Mood and Affect: Mood normal.   Data Reviewed:  There are no new results to review at this time.  Assessment and Plan: Principal Problem:   Asthma exacerbation Observation/telemetry. Supplemental oxygen as needed. DuoNeb 3 times a day. Albuterol neb every 4 hours as needed. Continue montelukast 10 mg at night. Continue Solu-Medrol 40 mg IV x1 this afternoon. Begin prednisone 40 mg p.o. daily tomorrow. Resume Advair inhaler twice a day after discharge.  Active Problems:   Anxiety Continue  sertraline 50 mg p.o. at bedtime.    HTN (hypertension) Continue lisinopril 20 mg p.o. nightly. Continue HCTZ 12.5 mg p.o. nightly. Will need regular potassium supplementation    Hyperlipemia Continue atorvastatin 10 mg p.o. nightly.    Hypokalemia Has had toes/lower extremities cramps. Continue potassium repletion. Follow potassium level in AM. Begin KCl 20 mEq p.o. daily. Will need KCl prescription on discharge.    Hypophosphatemia Replacement ordered. Follow-up phosphorus level as needed.    Advance Care Planning:   Code Status: Full Code   Consults:   Family Communication:   Severity of Illness: The appropriate patient status for this patient is OBSERVATION. Observation status is judged to be reasonable and necessary in order to provide the required intensity of service to ensure the patient's safety. The patient's presenting symptoms, physical exam findings, and  initial radiographic and laboratory data in the context of their medical condition is felt to place them at decreased risk for further clinical deterioration. Furthermore, it is anticipated that the patient will be medically stable for discharge from the hospital within 2 midnights of admission.   Author: Reubin Milan, MD 06/22/2021 1:10 PM  For on call review [email or web address].   This document was prepared using Dragon voice recognition software and may contain some unintended transcription errors.

## 2021-06-22 NOTE — ED Notes (Signed)
Ortiz, MD at bedside.  

## 2021-06-22 NOTE — ED Notes (Signed)
Ems contacted  ?

## 2021-06-23 DIAGNOSIS — F419 Anxiety disorder, unspecified: Secondary | ICD-10-CM | POA: Diagnosis not present

## 2021-06-23 DIAGNOSIS — E7849 Other hyperlipidemia: Secondary | ICD-10-CM

## 2021-06-23 DIAGNOSIS — I10 Essential (primary) hypertension: Secondary | ICD-10-CM

## 2021-06-23 DIAGNOSIS — J45901 Unspecified asthma with (acute) exacerbation: Secondary | ICD-10-CM

## 2021-06-23 LAB — BASIC METABOLIC PANEL
Anion gap: 9 (ref 5–15)
BUN: 20 mg/dL (ref 8–23)
CO2: 21 mmol/L — ABNORMAL LOW (ref 22–32)
Calcium: 9.1 mg/dL (ref 8.9–10.3)
Chloride: 104 mmol/L (ref 98–111)
Creatinine, Ser: 1.09 mg/dL — ABNORMAL HIGH (ref 0.44–1.00)
GFR, Estimated: 55 mL/min — ABNORMAL LOW (ref >60)
Glucose, Bld: 229 mg/dL — ABNORMAL HIGH (ref 70–99)
Potassium: 3.5 mmol/L (ref 3.5–5.1)
Sodium: 134 mmol/L — ABNORMAL LOW (ref 135–145)

## 2021-06-23 LAB — CBC
HCT: 37.9 % (ref 36.0–46.0)
Hemoglobin: 12.7 g/dL (ref 12.0–15.0)
MCH: 28.8 pg (ref 26.0–34.0)
MCHC: 33.5 g/dL (ref 30.0–36.0)
MCV: 85.9 fL (ref 80.0–100.0)
Platelets: 326 10*3/uL (ref 150–400)
RBC: 4.41 MIL/uL (ref 3.87–5.11)
RDW: 13.2 % (ref 11.5–15.5)
WBC: 20.1 10*3/uL — ABNORMAL HIGH (ref 4.0–10.5)
nRBC: 0 % (ref 0.0–0.2)

## 2021-06-23 LAB — HIV ANTIBODY (ROUTINE TESTING W REFLEX): HIV Screen 4th Generation wRfx: NONREACTIVE

## 2021-06-23 LAB — PROCALCITONIN: Procalcitonin: <0.1 ng/mL

## 2021-06-23 MED ORDER — OMEPRAZOLE MAGNESIUM 20 MG PO TBEC: 20.0000 mg | DELAYED_RELEASE_TABLET | Freq: Every day | ORAL | 0 refills | Status: AC

## 2021-06-23 MED ORDER — PREDNISONE 10 MG PO TABS: ORAL_TABLET | ORAL | 0 refills | Status: DC

## 2021-06-23 MED ORDER — LEVALBUTEROL HCL 1.25 MG/0.5ML IN NEBU: 1.2500 mg | INHALATION_SOLUTION | Freq: Two times a day (BID) | RESPIRATORY_TRACT | Status: DC

## 2021-06-23 MED ORDER — POTASSIUM CHLORIDE CRYS ER 20 MEQ PO TBCR: 20.0000 meq | EXTENDED_RELEASE_TABLET | Freq: Every day | ORAL | 0 refills | Status: AC

## 2021-06-23 MED ORDER — IPRATROPIUM BROMIDE 0.02 % IN SOLN: 0.5000 mg | Freq: Two times a day (BID) | RESPIRATORY_TRACT | Status: DC

## 2021-06-23 NOTE — Hospital Course (Addendum)
Joanna Preston is a 71 y.o. 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  In the ED, patient had WBC elevated at 11.5 with 63% neutrophils.  Sodium 133 with potassium of 2.7.  Phosphorus low at 2.0.  Chest x-ray was negative for acute infiltrate.  She was however noted to be tachypneic with dyspnea and was put on 2 L of oxygen it was admitted to hospital for further evaluation and treatment of asthma exacerbation.Marland Kitchen

## 2021-06-23 NOTE — Assessment & Plan Note (Addendum)
Mildly low.  Received K-Phos yesterday.  Would recommend BMP mag phos check as outpatient.

## 2021-06-23 NOTE — Assessment & Plan Note (Addendum)
On lisinopril HCTZ at home.  Will be resumed on discharge.  Also given potassium on discharge

## 2021-06-23 NOTE — Assessment & Plan Note (Addendum)
Patient below on presentation.  Has been replenished.  Potassium 3.5 today.  Continue potassium for the next 5 days.

## 2021-06-23 NOTE — Assessment & Plan Note (Signed)
-  Continue Lipitor °

## 2021-06-23 NOTE — Assessment & Plan Note (Signed)
Sertraline at bedtime

## 2021-06-23 NOTE — Discharge Summary (Signed)
Physician Discharge Summary   Patient: Joanna Preston MRN: [id number] DOB: 1950/10/07  Admit date:     06/22/2021  Discharge date: 06/23/21  Discharge Physician: Joycelyn Das   PCP: Teena Irani, PA-C   Recommendations at discharge:   Follow-up with your primary care physician in 1 week.  Check CBC BMP magnesium phosphorus at that time.  Patient has been prescribed prednisone taper on discharge.  Discharge Diagnoses: Active Problems:   Anxiety   HTN (hypertension)   Hyperlipemia   Hypokalemia   Hypophosphatemia  Principal Problem (Resolved):   Asthma exacerbation   Hospital Course: Joanna Preston is a 71 y.o. 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  In the ED, patient had WBC elevated at 11.5 with 63% neutrophils.  Sodium 133 with potassium of 2.7.  Phosphorus low at 2.0.  Chest x-ray was negative for acute infiltrate.  She was however noted to be tachypneic with dyspnea and was put on 2 L of oxygen it was admitted to hospital for further evaluation and treatment of asthma exacerbation..  Assessment and Plan: * Asthma exacerbation-resolved as of 06/23/2021, (present on admission) Much improved this morning.  No wheezing.  Patient received e Solu-Medrol, montelukast, DuoNebs, supplemental oxygen during hospitalization.  Currently off supplemental oxygen.  No wheezing.  Will change to p.o. prednisone taper on discharge.  Procalcitonin less than 0.10.  Magnesium of 1.9.  COVID and influenza was negative.  Chest x-ray without acute findings.  Hypophosphatemia- (present on admission) Mildly low.  Received K-Phos yesterday.  Would recommend BMP mag phos check as outpatient.  Hypokalemia- (present on admission) Patient below on  presentation.  Has been replenished.  Potassium 3.5 today.  Continue potassium for the next 5 days.  Hyperlipemia- (present on admission) Continue Lipitor  HTN (hypertension)- (present on admission) On lisinopril HCTZ at home.  Will be resumed on discharge.  Also given potassium on discharge  Anxiety- (present on admission) Sertraline at bedtime   Consultants: None  Procedures performed: None  Disposition: Home  Diet recommendation:  Discharge Diet Orders (From admission, onward)     Start     Ordered   06/23/21 0000  Diet - low sodium heart healthy        06/23/21 1024           Cardiac diet  DISCHARGE MEDICATION: Allergies as of 06/23/2021       Reactions   Ethanol Anaphylaxis   Fish-derived Products Anaphylaxis   Shellfish Allergy Anaphylaxis   Influenza Vaccines Hives   Codeine Nausea And Vomiting   Covid-19 Mrna Vacc (moderna) Diarrhea, Nausea And Vomiting, Cough        Medication List     TAKE these medications    albuterol 108 (90 Base) MCG/ACT inhaler Commonly known as: VENTOLIN HFA Inhale 2 puffs into the lungs every 4 (four) hours as needed for shortness of breath or wheezing.   atorvastatin 10 MG tablet Commonly known as: LIPITOR Take 10 mg by mouth at bedtime.   EPINEPHrine 0.3 mg/0.3 mL Soaj injection Commonly known as: EPI-PEN Inject 0.3 mg into the muscle as needed for anaphylaxis (AND CALL 9-1-1, IF USED).   fluticasone-salmeterol 500-50 MCG/ACT Aepb Commonly known as: ADVAIR Inhale 1 puff into the lungs in the morning and at bedtime.   lisinopril-hydrochlorothiazide 20-12.5 MG tablet Commonly known as: ZESTORETIC Take  1 tablet by mouth at bedtime.   montelukast 10 MG tablet Commonly known as: SINGULAIR Take 10 mg by mouth at bedtime.   multivitamin tablet Take 1 tablet by mouth daily with breakfast.   nystatin cream Commonly known as: MYCOSTATIN Apply 1 application topically 2 (two) times daily. Apply to affected area BID  for up to 7 days. What changed:  when to take this reasons to take this additional instructions   omeprazole 20 MG tablet Commonly known as: PriLOSEC OTC Take 1 tablet (20 mg total) by mouth daily for 7 days.   potassium chloride SA 20 MEQ tablet Commonly known as: KLOR-CON M Take 1 tablet (20 mEq total) by mouth daily for 5 days.   predniSONE 10 MG tablet Commonly known as: DELTASONE Take 4 tablets (40 mg) daily for 2 days, then, Take 3 tablets (30 mg) daily for 2 days, then, Take 2 tablets (20 mg) daily for 2 days, then, Take 1 tablets (10 mg) daily for 1 days, then stop   sertraline 25 MG tablet Commonly known as: ZOLOFT Take 25 mg by mouth at bedtime.   sertraline 50 MG tablet Commonly known as: ZOLOFT Take 50 mg by mouth at bedtime.        Follow-up Information     Teena Irani, PA-C Follow up in 1 week(s).   Specialty: Physician Assistant Contact information: [address] [place] [address] [phone]                Subjective: Today, patient was seen and examined at bedside.  Feels better with breathing.  No wheezes .  Has ambulated without dyspnea or shortness of breath.  Discharge Exam: Vitals with BMI 06/23/2021 06/23/2021 06/22/2021  Height - - -  Weight - - -  BMI - - -  Systolic 157 148 916  Diastolic 88 80 76  Pulse 106 99 101    General:  Average built, not in obvious distress, on room air, elderly female. HENT:   No scleral pallor or icterus noted. Oral mucosa is moist.  Chest:   Diminished breath sounds bilaterally. No crackles or wheezes.  CVS: S1 &S2 heard. No murmur.  Regular rate and rhythm. Abdomen: Soft, nontender, nondistended.  Bowel sounds are heard.   Extremities: No cyanosis, clubbing or edema.  Peripheral pulses are palpable. Psych: Alert, awake and oriented, normal mood CNS:  No cranial nerve deficits.  Power equal in all extremities.   Skin: Warm and dry.  No rashes noted.   Condition at discharge:  good  The results of significant diagnostics from this hospitalization (including imaging, microbiology, ancillary and laboratory) are listed below for reference.   Imaging Studies: DG Chest Port 1 View  Result Date: 06/22/2021 CLINICAL DATA:  Shortness of breath, cough EXAM: PORTABLE CHEST 1 VIEW COMPARISON:  None. FINDINGS: The heart size and mediastinal contours are within normal limits. Both lungs are clear. The visualized skeletal structures are unremarkable. IMPRESSION: No active disease. Electronically Signed   By: Ernie Avena M.D.   On: 06/22/2021 10:49    Microbiology: Results for orders placed or performed during the hospital encounter of 06/22/21  Resp Panel by RT-PCR (Flu A&B, Covid) Nasopharyngeal Swab     Status: None   Collection Time: 06/22/21 10:33 AM   Specimen: Nasopharyngeal Swab; Nasopharyngeal(NP) swabs in vial transport medium  Result Value Ref Range Status   SARS Coronavirus 2 by RT PCR NEGATIVE NEGATIVE Final    Comment: (NOTE) SARS-CoV-2 target nucleic acids  are NOT DETECTED.  The SARS-CoV-2 RNA is generally detectable in upper respiratory specimens during the acute phase of infection. The lowest concentration of SARS-CoV-2 viral copies this assay can detect is 138 copies/mL. A negative result does not preclude SARS-Cov-2 infection and should not be used as the sole basis for treatment or other patient management decisions. A negative result may occur with  improper specimen collection/handling, submission of specimen other than nasopharyngeal swab, presence of viral mutation(s) within the areas targeted by this assay, and inadequate number of viral copies(<138 copies/mL). A negative result must be combined with clinical observations, patient history, and epidemiological information. The expected result is Negative.  Fact Sheet for Patients:  BloggerCourse.com  Fact Sheet for Healthcare Providers:   SeriousBroker.it  This test is no t yet approved or cleared by the [place] FDA and  has been authorized for detection and/or diagnosis of SARS-CoV-2 by FDA under an Emergency Use Authorization (EUA). This EUA will remain  in effect (meaning this test can be used) for the duration of the COVID-19 declaration under Section 564(b)(1) of the Act, 21 U.S.C.section 360bbb-3(b)(1), unless the authorization is terminated  or revoked sooner.       Influenza A by PCR NEGATIVE NEGATIVE Final   Influenza B by PCR NEGATIVE NEGATIVE Final    Comment: (NOTE) The Xpert Xpress SARS-CoV-2/FLU/RSV plus assay is intended as an aid in the diagnosis of influenza from Nasopharyngeal swab specimens and should not be used as a sole basis for treatment. Nasal washings and aspirates are unacceptable for Xpert Xpress SARS-CoV-2/FLU/RSV testing.  Fact Sheet for Patients: BloggerCourse.com  Fact Sheet for Healthcare Providers: SeriousBroker.it  This test is not yet approved or cleared by the [place] FDA and has been authorized for detection and/or diagnosis of SARS-CoV-2 by FDA under an Emergency Use Authorization (EUA). This EUA will remain in effect (meaning this test can be used) for the duration of the COVID-19 declaration under Section 564(b)(1) of the Act, 21 U.S.C. section 360bbb-3(b)(1), unless the authorization is terminated or revoked.  Performed at [facility], [address], [place], [place] [address]     Labs: CBC: Recent Labs  Lab 06/22/21 1032 06/23/21 0608  WBC 11.5* 20.1*  NEUTROABS 7.3  --   HGB 13.9 12.7  HCT 39.4 37.9  MCV 83.3 85.9  PLT 318 326   Basic Metabolic Panel: Recent Labs  Lab 06/22/21 1032 06/23/21 0608  NA 133* 134*  K 2.7* 3.5  CL 99 104  CO2 22 21*  GLUCOSE 137* 229*  BUN 15 20  CREATININE 0.95 1.09*  CALCIUM 9.7 9.1  MG 1.9  --   PHOS  2.0*  --    Liver Function Tests: No results for input(s): AST, ALT, ALKPHOS, BILITOT, PROT, ALBUMIN in the last 168 hours. CBG: No results for input(s): GLUCAP in the last 168 hours.  Discharge time spent: greater than 30 minutes.  Signed: Joycelyn Das, MD [facility] 06/23/2021

## 2021-06-23 NOTE — Assessment & Plan Note (Addendum)
Much improved this morning.  No wheezing.  Patient received e Solu-Medrol, montelukast, DuoNebs, supplemental oxygen during hospitalization.  Currently off supplemental oxygen.  No wheezing.  Will change to p.o. prednisone taper on discharge.  Procalcitonin less than 0.10.  Magnesium of 1.9.  COVID and influenza was negative.  Chest x-ray without acute findings.

## 2021-06-23 NOTE — Progress Notes (Signed)
Pt weaned to room air. Saturations maintaining > 94%.

## 2021-06-23 NOTE — Plan of Care (Signed)
Pt for discharge this morning home with spouse.  AVS printed and reviewed at bedside.  New medications and follow ups discussed in detail.  All questions addressed, verbalized understanding.  IV and telemetry removed.  Family to transport home.   Problem: Education: Goal: Knowledge of disease or condition will improve 06/23/2021 1055 by Rutherford Guys, RN Outcome: Adequate for Discharge   Problem: Education: Goal: Knowledge of the prescribed therapeutic regimen will improve 06/23/2021 1055 by Rutherford Guys, RN Outcome: Adequate for Discharge   Problem: Activity: Goal: Ability to tolerate increased activity will improve 06/23/2021 1055 by Rutherford Guys, RN Outcome: Adequate for Discharge   Problem: Activity: Goal: Will verbalize the importance of balancing activity with adequate rest periods 06/23/2021 1055 by Rutherford Guys, RN Outcome: Adequate for Discharge   Problem: Respiratory: Goal: Ability to maintain a clear airway will improve 06/23/2021 1055 by Rutherford Guys, RN Outcome: Adequate for Discharge

## 2021-06-23 NOTE — Plan of Care (Signed)
Pt aox4, pleasant and cooperative.  PO steriods, Breathing Tx. Weaned off O2 Telemetry ordered, sinus tachycardia.  Potassium replacement per orders.   Problem: Education: Goal: Knowledge of disease or condition will improve Outcome: Progressing Goal: Knowledge of the prescribed therapeutic regimen will improve Outcome: Progressing Goal: Individualized Educational Video(s) Outcome: Progressing   Problem: Activity: Goal: Ability to tolerate increased activity will improve Outcome: Progressing Goal: Will verbalize the importance of balancing activity with adequate rest periods Outcome: Progressing   Problem: Respiratory: Goal: Ability to maintain a clear airway will improve Outcome: Progressing Goal: Levels of oxygenation will improve Outcome: Progressing Goal: Ability to maintain adequate ventilation will improve Outcome: Progressing   Problem: Education: Goal: Knowledge of General Education information will improve Description: Including pain rating scale, medication(s)/side effects and non-pharmacologic comfort measures Outcome: Progressing   Problem: Health Behavior/Discharge Planning: Goal: Ability to manage health-related needs will improve Outcome: Progressing   Problem: Clinical Measurements: Goal: Ability to maintain clinical measurements within normal limits will improve Outcome: Progressing Goal: Will remain free from infection Outcome: Progressing Goal: Diagnostic test results will improve Outcome: Progressing Goal: Respiratory complications will improve Outcome: Progressing Goal: Cardiovascular complication will be avoided Outcome: Progressing   Problem: Activity: Goal: Risk for activity intolerance will decrease Outcome: Progressing   Problem: Nutrition: Goal: Adequate nutrition will be maintained Outcome: Progressing   Problem: Coping: Goal: Level of anxiety will decrease Outcome: Progressing   Problem: Elimination: Goal: Will not experience  complications related to bowel motility Outcome: Progressing Goal: Will not experience complications related to urinary retention Outcome: Progressing   Problem: Pain Managment: Goal: General experience of comfort will improve Outcome: Progressing   Problem: Safety: Goal: Ability to remain free from injury will improve Outcome: Progressing   Problem: Skin Integrity: Goal: Risk for impaired skin integrity will decrease Outcome: Progressing

## 2021-07-03 ENCOUNTER — Ambulatory Visit
Admission: RE | Admit: 2021-07-03 | Discharge: 2021-07-03 | Disposition: A | Discharge status: Home / Self Care | Payer: Medicare HMO | Source: Ambulatory Visit | Attending: Physician Assistant | Admitting: Physician Assistant

## 2021-07-03 DIAGNOSIS — E2839 Other primary ovarian failure: Secondary | ICD-10-CM

## 2021-09-04 ENCOUNTER — Other Ambulatory Visit: Payer: Self-pay

## 2021-09-04 ENCOUNTER — Ambulatory Visit
Admission: EM | Admit: 2021-09-04 | Discharge: 2021-09-04 | Disposition: A | Discharge status: Home / Self Care | Payer: Medicare HMO | Attending: Physician Assistant | Admitting: Physician Assistant

## 2021-09-04 ENCOUNTER — Encounter: Payer: Self-pay | Admitting: Emergency Medicine

## 2021-09-04 DIAGNOSIS — N3001 Acute cystitis with hematuria: Secondary | ICD-10-CM | POA: Diagnosis present

## 2021-09-04 DIAGNOSIS — R300 Dysuria: Secondary | ICD-10-CM | POA: Insufficient documentation

## 2021-09-04 LAB — POCT URINALYSIS DIP (MANUAL ENTRY)
Bilirubin, UA: NEGATIVE
Glucose, UA: NEGATIVE mg/dL
Ketones, POC UA: NEGATIVE mg/dL
Nitrite, UA: POSITIVE — AB
Protein Ur, POC: NEGATIVE mg/dL
Spec Grav, UA: 1.015 (ref 1.010–1.025)
Urobilinogen, UA: 0.2 E.U./dL
pH, UA: 6.5 (ref 5.0–8.0)

## 2021-09-04 MED ORDER — CEPHALEXIN 500 MG PO CAPS
500.0000 mg | ORAL_CAPSULE | Freq: Four times a day (QID) | ORAL | 0 refills | Status: AC
Start: 2021-09-04 — End: ?

## 2021-09-04 NOTE — ED Provider Notes (Signed)
?[place] ? ? ? ?CSN: [id number] ?Arrival date & time: 09/04/21  [id number] ? ? ?  ? ?History   ?Chief Complaint ?Chief Complaint  ?Patient presents with  ? Dysuria  ? ? ?HPI ?Joanna Preston is a 71 y.o. female.  ? ?Patient is today with a 2-day history of UTI symptoms.  She reports bladder spasms, dysuria, urinary frequency, urinary urgency.  She denies any fever, abdominal pain, nausea, vomiting, hematuria, vaginal symptoms.  She does have a history of urinary tract infections and reports current symptoms are similar to previous episodes of this condition.  She denies any recent antibiotic use.  She has not tried any over-the-counter medication for symptom management.  Denies any recent urogenital procedure, catheterization, history of nephrolithiasis, seeing a urologist in the past.  She does not have diabetes and denies any immunosuppression.  She does not take SGLT2 inhibitors. ? ? ?Past Medical History:  ?Diagnosis Date  ? Anxiety   ? Asthma   ? Hyperlipemia 05/29/2011  ? Hypertension   ? Osteoporosis 3/12  ? spine/nl hip  ? Vitamin D deficiency   ? ? ?Patient Active Problem List  ? Diagnosis Date Noted  ? Hypokalemia 06/22/2021  ? Hypophosphatemia 06/22/2021  ? Severe obesity (BMI 35.0-39.9) with comorbidity ([place]) 07/27/2018  ? Anxiety 06/27/2013  ? HTN (hypertension) 06/27/2013  ? Osteoporosis 06/27/2013  ? Unspecified vitamin D deficiency 06/27/2013  ? Asthma 05/27/2012  ? Hyperlipemia 05/29/2011  ? ? ?Past Surgical History:  ?Procedure Laterality Date  ? CHOLECYSTECTOMY, LAPAROSCOPIC  2013  ? TUBAL LIGATION Bilateral 1987  ? WISDOM TOOTH EXTRACTION  age 39  ? ? ?OB History   ? ? Gravida  ?2  ? Para  ?2  ? Term  ?1  ? Preterm  ?1  ? AB  ?0  ? Living  ?1  ?  ? ? SAB  ?0  ? IAB  ?0  ? Ectopic  ?0  ? Multiple  ?0  ? Live Births  ?1  ?   ?  ?  ? ? ? ?Home Medications   ? ?Prior to Admission medications   ?Medication Sig Start Date End Date Taking? Authorizing Provider  ?cephALEXin (KEFLEX) 500 MG  capsule Take 1 capsule (500 mg total) by mouth 4 (four) times daily. 09/04/21  Yes Appollonia Klee, Junie Panning K, PA-C  ?albuterol (VENTOLIN HFA) 108 (90 Base) MCG/ACT inhaler Inhale 2 puffs into the lungs every 4 (four) hours as needed for shortness of breath or wheezing. 10/07/18   [provider]  ?atorvastatin (LIPITOR) 10 MG tablet Take 10 mg by mouth at bedtime. 05/29/14   [provider]  ?EPINEPHrine 0.3 mg/0.3 mL IJ SOAJ injection Inject 0.3 mg into the muscle as needed for anaphylaxis (AND CALL 9-1-1, IF USED). 01/03/21   [provider]  ?fluticasone-salmeterol (ADVAIR) 500-50 MCG/ACT AEPB Inhale 1 puff into the lungs in the morning and at bedtime.    [provider]  ?lisinopril-hydrochlorothiazide (PRINZIDE,ZESTORETIC) 20-12.5 MG per tablet Take 1 tablet by mouth at bedtime.    [provider]  ?montelukast (SINGULAIR) 10 MG tablet Take 10 mg by mouth at bedtime. 10/06/18   [provider]  ?Multiple Vitamin (MULTIVITAMIN) tablet Take 1 tablet by mouth daily with breakfast.    [provider]  ?nystatin cream (MYCOSTATIN) Apply 1 application topically 2 (two) times daily. Apply to affected area BID for up to 7 days. ?Patient taking differently: Apply 1 application topically 2 (two) times daily as needed (  to affected areas for eczema (for up to 7 days at a time)). 05/16/20   Salvadore Dom, MD  ?omeprazole (PRILOSEC OTC) 20 MG tablet Take 1 tablet (20 mg total) by mouth daily for 7 days. 06/23/21 06/30/21  Pokhrel, Corrie Mckusick, MD  ?potassium chloride SA (KLOR-CON M) 20 MEQ tablet Take 1 tablet (20 mEq total) by mouth daily for 5 days. 06/23/21 06/28/21  Pokhrel, Corrie Mckusick, MD  ?predniSONE (DELTASONE) 10 MG tablet Take 4 tablets (40 mg) daily for 2 days, then, Take 3 tablets (30 mg) daily for 2 days, then, Take 2 tablets (20 mg) daily for 2 days, then, Take 1 tablets (10 mg) daily for 1 days, then stop ?Patient not taking: Reported on 09/04/2021 06/23/21   Flora Lipps,  MD  ?sertraline (ZOLOFT) 25 MG tablet Take 25 mg by mouth at bedtime.    [provider]  ?sertraline (ZOLOFT) 50 MG tablet Take 50 mg by mouth at bedtime. ?Patient not taking: Reported on 06/22/2021 04/10/14   [provider]  ? ? ?Family History ?Family History  ?Problem Relation Age of Onset  ? Heart disease Mother   ? Heart failure Mother   ? Hypertension Mother   ? Hyperlipidemia Mother   ? Heart disease Father   ? Lung cancer Brother   ? Breast cancer Neg Hx   ? ? ?Social History ?Social History  ? ?Tobacco Use  ? Smoking status: Never  ? Smokeless tobacco: Never  ?Vaping Use  ? Vaping Use: Never used  ?Substance Use Topics  ? Alcohol use: No  ? Drug use: No  ? ? ? ?Allergies   ?Ethanol, Fish-derived products, Shellfish allergy, Influenza vaccines, Codeine, and Covid-19 mrna vacc (moderna) ? ? ?Review of Systems ?Review of Systems  ?Constitutional:  Positive for activity change. Negative for appetite change, fatigue and fever.  ?Respiratory:  Negative for cough and shortness of breath.   ?Cardiovascular:  Negative for chest pain.  ?Gastrointestinal:  Negative for abdominal pain, diarrhea, nausea and vomiting.  ?Genitourinary:  Positive for dysuria, frequency and urgency. Negative for flank pain, genital sores, vaginal bleeding, vaginal discharge and vaginal pain.  ? ? ?Physical Exam ?Triage Vital Signs ?ED Triage Vitals [09/04/21 1148]  ?Enc Vitals Group  ?   BP 133/69  ?   Pulse Rate 89  ?   Resp 18  ?   Temp 98.1 ?F (36.7 ?C)  ?   Temp Source Oral  ?   SpO2 96 %  ?   Weight   ?   Height   ?   Head Circumference   ?   Peak Flow   ?   Pain Score 2  ?   Pain Loc   ?   Pain Edu?   ?   Excl. in [place]?   ? ?No data found. ? ?Updated Vital Signs ?BP 133/69 (BP Location: Left Arm)   Pulse 89   Temp 98.1 ?F (36.7 ?C) (Oral)   Resp 18   LMP 04/28/1988 (Approximate)   SpO2 96%  ? ?Visual Acuity ?Right Eye Distance:   ?Left Eye Distance:   ?Bilateral Distance:   ? ?Right Eye Near:   ?Left Eye Near:     ?Bilateral Near:    ? ?Physical Exam ?Vitals reviewed.  ?Constitutional:   ?   General: She is awake. She is not in acute distress. ?   Appearance: Normal appearance. She is well-developed. She is not ill-appearing.  ?   Comments: Very pleasant female  appears stated age in no acute distress sitting comfortably in exam room  ?HENT:  ?   Head: Normocephalic and atraumatic.  ?Cardiovascular:  ?   Rate and Rhythm: Normal rate and regular rhythm.  ?   Heart sounds: Normal heart sounds, S1 normal and S2 normal. No murmur heard. ?Pulmonary:  ?   Effort: Pulmonary effort is normal.  ?   Breath sounds: Normal breath sounds. No wheezing, rhonchi or rales.  ?   Comments: Clear to auscultation bilaterally ?Abdominal:  ?   General: Bowel sounds are normal.  ?   Palpations: Abdomen is soft.  ?   Tenderness: There is no abdominal tenderness. There is no right CVA tenderness, left CVA tenderness, guarding or rebound.  ?   Comments: Benign abdominal exam.  No CVA tenderness.  ?Psychiatric:     ?   Behavior: Behavior is cooperative.  ? ? ? ?UC Treatments / Results  ?Labs ?(all labs ordered are listed, but only abnormal results are displayed) ?Labs Reviewed  ?POCT URINALYSIS DIP (MANUAL ENTRY) - Abnormal; Notable for the following components:  ?    Result Value  ? Color, UA orange (*)   ? Clarity, UA cloudy (*)   ? Blood, UA trace-lysed (*)   ? Nitrite, UA Positive (*)   ? Leukocytes, UA Small (1+) (*)   ? All other components within normal limits  ?URINE CULTURE  ? ? ?EKG ? ? ?Radiology ?No results found. ? ?Procedures ?Procedures (including critical care time) ? ?Medications Ordered in UC ?Medications - No data to display ? ?Initial Impression / Assessment and Plan / UC Course  ?I have reviewed the triage vital signs and the nursing notes. ? ?Pertinent labs & imaging results that were available during my care of the patient were reviewed by me and considered in my medical decision making (see chart for details). ? ?  ? ?Patient is  well-appearing, afebrile, nontoxic, nontachycardic.  UA positive for nitrites and leuks.  Will treat for UTI.  Patient started on Keflex 500 mg 4 times daily for 5 days.  No indication for dose adjustment bas

## 2021-09-04 NOTE — ED Triage Notes (Signed)
Pt here for dysuria with hx of similar x 3 days  ?

## 2021-09-04 NOTE — Discharge Instructions (Signed)
Your urine looks like you have a urinary tract infection.  Please start cephalexin 1 tablet 4 times daily.  Make sure you rest and drink plenty of fluid.  We are sending her urine off for culture and if we need to change antibiotics we will contact you.  If anything worsens and you have abdominal pain, nausea, vomiting, fever you should be seen immediately.  If symptoms or not improving within a few days please return here or see your PCP. ?

## 2021-09-06 LAB — URINE CULTURE: Culture: >=100000 — AB

## 2022-01-11 ENCOUNTER — Encounter: Payer: Self-pay | Admitting: Emergency Medicine

## 2022-01-11 ENCOUNTER — Ambulatory Visit
Admission: EM | Admit: 2022-01-11 | Discharge: 2022-01-11 | Disposition: A | Discharge status: Home / Self Care | Payer: Medicare HMO | Attending: Physician Assistant | Admitting: Physician Assistant

## 2022-01-11 DIAGNOSIS — J45909 Unspecified asthma, uncomplicated: Secondary | ICD-10-CM | POA: Diagnosis not present

## 2022-01-11 DIAGNOSIS — J069 Acute upper respiratory infection, unspecified: Secondary | ICD-10-CM

## 2022-01-11 MED ORDER — PREDNISONE 20 MG PO TABS: 40.0000 mg | ORAL_TABLET | Freq: Every day | ORAL | 0 refills | Status: AC

## 2022-01-11 NOTE — ED Triage Notes (Signed)
Pt said since yesterday has had nasal drainage with a cough now. Pt said no fevers, no chills. Mucus is normal color. Hx of asthma.

## 2022-01-11 NOTE — ED Provider Notes (Signed)
[facility]    CSN: [id number] Arrival date & time: 01/11/22  0954      History   Chief Complaint Chief Complaint  Patient presents with   Cough    HPI Joanna Preston is a 71 y.o. female.   Patient here today for evaluation of nasal drainage and cough that started yesterday.  She has not had any fevers or chills.  She does have history of asthma and was instructed to be seen early due to same.  She reports her mucus is normal color.  She has not had any nausea, vomiting or diarrhea.  She declines COVID screening at this time.  The history is provided by the patient.  Cough Associated symptoms: no chills, no ear pain, no eye discharge, no fever, no shortness of breath, no sore throat and no wheezing     Past Medical History:  Diagnosis Date   Anxiety    Asthma    Hyperlipemia 05/29/2011   Hypertension    Osteoporosis 3/12   spine/nl hip   Vitamin D deficiency     Patient Active Problem List   Diagnosis Date Noted   Hypokalemia 06/22/2021   Hypophosphatemia 06/22/2021   Severe obesity (BMI 35.0-39.9) with comorbidity (HCC) 07/27/2018   Anxiety 06/27/2013   HTN (hypertension) 06/27/2013   Osteoporosis 06/27/2013   Unspecified vitamin D deficiency 06/27/2013   Asthma 05/27/2012   Hyperlipemia 05/29/2011    Past Surgical History:  Procedure Laterality Date   CHOLECYSTECTOMY, LAPAROSCOPIC  2013   TUBAL LIGATION Bilateral 1987   WISDOM TOOTH EXTRACTION  age 32    OB History     Gravida  2   Para  2   Term  1   Preterm  1   AB  0   Living  1      SAB  0   IAB  0   Ectopic  0   Multiple  0   Live Births  1            Home Medications    Prior to Admission medications   Medication Sig Start Date End Date Taking? Authorizing Provider  predniSONE (DELTASONE) 20 MG tablet Take 2 tablets (40 mg total) by mouth daily with breakfast for 5 days. 01/11/22 01/16/22 Yes Tomi Bamberger, PA-C  albuterol (VENTOLIN HFA) 108 (90  Base) MCG/ACT inhaler Inhale 2 puffs into the lungs every 4 (four) hours as needed for shortness of breath or wheezing. 10/07/18   [provider]  atorvastatin (LIPITOR) 10 MG tablet Take 10 mg by mouth at bedtime. 05/29/14   [provider]  cephALEXin (KEFLEX) 500 MG capsule Take 1 capsule (500 mg total) by mouth 4 (four) times daily. 09/04/21   Raspet, Noberto Retort, PA-C  EPINEPHrine 0.3 mg/0.3 mL IJ SOAJ injection Inject 0.3 mg into the muscle as needed for anaphylaxis (AND CALL 9-1-1, IF USED). 01/03/21   [provider]  fluticasone-salmeterol (ADVAIR) 500-50 MCG/ACT AEPB Inhale 1 puff into the lungs in the morning and at bedtime.    [provider]  lisinopril-hydrochlorothiazide (PRINZIDE,ZESTORETIC) 20-12.5 MG per tablet Take 1 tablet by mouth at bedtime.    [provider]  montelukast (SINGULAIR) 10 MG tablet Take 10 mg by mouth at bedtime. 10/06/18   [provider]  Multiple Vitamin (MULTIVITAMIN) tablet Take 1 tablet by mouth daily with breakfast.    [provider]  nystatin cream (MYCOSTATIN) Apply 1 application topically 2 (two) times daily. Apply to  affected area BID for up to 7 days. Patient taking differently: Apply 1 application topically 2 (two) times daily as needed (to affected areas for eczema (for up to 7 days at a time)). 05/16/20   Romualdo Bolk, MD  omeprazole (PRILOSEC OTC) 20 MG tablet Take 1 tablet (20 mg total) by mouth daily for 7 days. 06/23/21 06/30/21  Pokhrel, Rebekah Chesterfield, MD  potassium chloride SA (KLOR-CON M) 20 MEQ tablet Take 1 tablet (20 mEq total) by mouth daily for 5 days. 06/23/21 06/28/21  Pokhrel, Rebekah Chesterfield, MD  sertraline (ZOLOFT) 25 MG tablet Take 25 mg by mouth at bedtime.    [provider]  sertraline (ZOLOFT) 50 MG tablet Take 50 mg by mouth at bedtime. Patient not taking: Reported on 06/22/2021 04/10/14   [provider]    Family History Family History  Problem Relation Age of Onset    Heart disease Mother    Heart failure Mother    Hypertension Mother    Hyperlipidemia Mother    Heart disease Father    Lung cancer Brother    Breast cancer Neg Hx     Social History Social History   Tobacco Use   Smoking status: Never   Smokeless tobacco: Never  Vaping Use   Vaping Use: Never used  Substance Use Topics   Alcohol use: No   Drug use: No     Allergies   Ethanol, Fish-derived products, Shellfish allergy, Influenza vaccines, Codeine, and Covid-19 mrna vacc (moderna)   Review of Systems Review of Systems  Constitutional:  Negative for chills and fever.  HENT:  Positive for congestion. Negative for ear pain and sore throat.   Eyes:  Negative for discharge and redness.  Respiratory:  Positive for cough. Negative for shortness of breath and wheezing.   Gastrointestinal:  Negative for diarrhea, nausea and vomiting.     Physical Exam Triage Vital Signs ED Triage Vitals  Enc Vitals Group     BP 01/11/22 1006 (!) 142/81     Pulse Rate 01/11/22 1006 89     Resp 01/11/22 1006 16     Temp 01/11/22 1006 98.3 F (36.8 C)     Temp Source 01/11/22 1006 Oral     SpO2 01/11/22 1006 95 %     Weight --      Height --      Head Circumference --      Peak Flow --      Pain Score 01/11/22 1004 0     Pain Loc --      Pain Edu? --      Excl. in GC? --    No data found.  Updated Vital Signs BP (!) 142/81 (BP Location: Left Arm)   Pulse 89   Temp 98.3 F (36.8 C) (Oral)   Resp 16   LMP 04/28/1988 (Approximate)   SpO2 95%      Physical Exam Vitals and nursing note reviewed.  Constitutional:      General: She is not in acute distress.    Appearance: Normal appearance. She is not ill-appearing.  HENT:     Head: Normocephalic and atraumatic.     Nose: Congestion present.     Mouth/Throat:     Mouth: Mucous membranes are moist.     Pharynx: No oropharyngeal exudate or posterior oropharyngeal erythema.  Eyes:     Conjunctiva/sclera: Conjunctivae  normal.  Cardiovascular:     Rate and Rhythm: Normal rate and regular rhythm.  Heart sounds: Normal heart sounds. No murmur heard. Pulmonary:     Effort: Pulmonary effort is normal. No respiratory distress.     Breath sounds: Normal breath sounds. No wheezing, rhonchi or rales.  Skin:    General: Skin is warm and dry.  Neurological:     Mental Status: She is alert.  Psychiatric:        Mood and Affect: Mood normal.        Thought Content: Thought content normal.      UC Treatments / Results  Labs (all labs ordered are listed, but only abnormal results are displayed) Labs Reviewed - No data to display  EKG   Radiology No results found.  Procedures Procedures (including critical care time)  Medications Ordered in UC Medications - No data to display  Initial Impression / Assessment and Plan / UC Course  I have reviewed the triage vital signs and the nursing notes.  Pertinent labs & imaging results that were available during my care of the patient were reviewed by me and considered in my medical decision making (see chart for details).    Steroid burst prescribed given known asthma in hopes to prevent development of significant exacerbation.  Recommended follow-up if no gradual improvement of symptoms or with any further concerns.  Final Clinical Impressions(s) / UC Diagnoses   Final diagnoses:  Acute upper respiratory infection  Asthma, unspecified asthma severity, unspecified whether complicated, unspecified whether persistent   Discharge Instructions   None    ED Prescriptions     Medication Sig Dispense Auth. Provider   predniSONE (DELTASONE) 20 MG tablet Take 2 tablets (40 mg total) by mouth daily with breakfast for 5 days. 10 tablet Francene Finders, PA-C      PDMP not reviewed this encounter.   Francene Finders, PA-C 01/11/22 1241

## 2022-01-15 ENCOUNTER — Other Ambulatory Visit: Payer: Self-pay | Admitting: Physician Assistant

## 2022-01-15 DIAGNOSIS — Z1231 Encounter for screening mammogram for malignant neoplasm of breast: Secondary | ICD-10-CM

## 2022-02-18 ENCOUNTER — Ambulatory Visit
Admission: RE | Admit: 2022-02-18 | Discharge: 2022-02-18 | Disposition: A | Discharge status: Home / Self Care | Payer: Medicare HMO | Source: Ambulatory Visit | Attending: Physician Assistant | Admitting: Physician Assistant

## 2022-02-18 DIAGNOSIS — Z1231 Encounter for screening mammogram for malignant neoplasm of breast: Secondary | ICD-10-CM

## 2023-02-02 ENCOUNTER — Other Ambulatory Visit: Payer: Self-pay | Admitting: Physician Assistant

## 2023-02-02 DIAGNOSIS — Z1231 Encounter for screening mammogram for malignant neoplasm of breast: Secondary | ICD-10-CM

## 2023-02-26 IMAGING — MG MM DIGITAL SCREENING BILAT W/ TOMO AND CAD
6 of 10 series · 6 of 30 positions shown · non-contrast
Comparison: Previous exam(s).

CLINICAL DATA: Screening.

EXAM:
DIGITAL SCREENING BILATERAL MAMMOGRAM WITH TOMOSYNTHESIS AND CAD
TECHNIQUE: Bilateral screening digital craniocaudal and mediolateral oblique
mammograms were obtained. Bilateral screening digital breast
tomosynthesis was performed. The images were evaluated with
computer-aided detection.

[L MLO synth-2D]
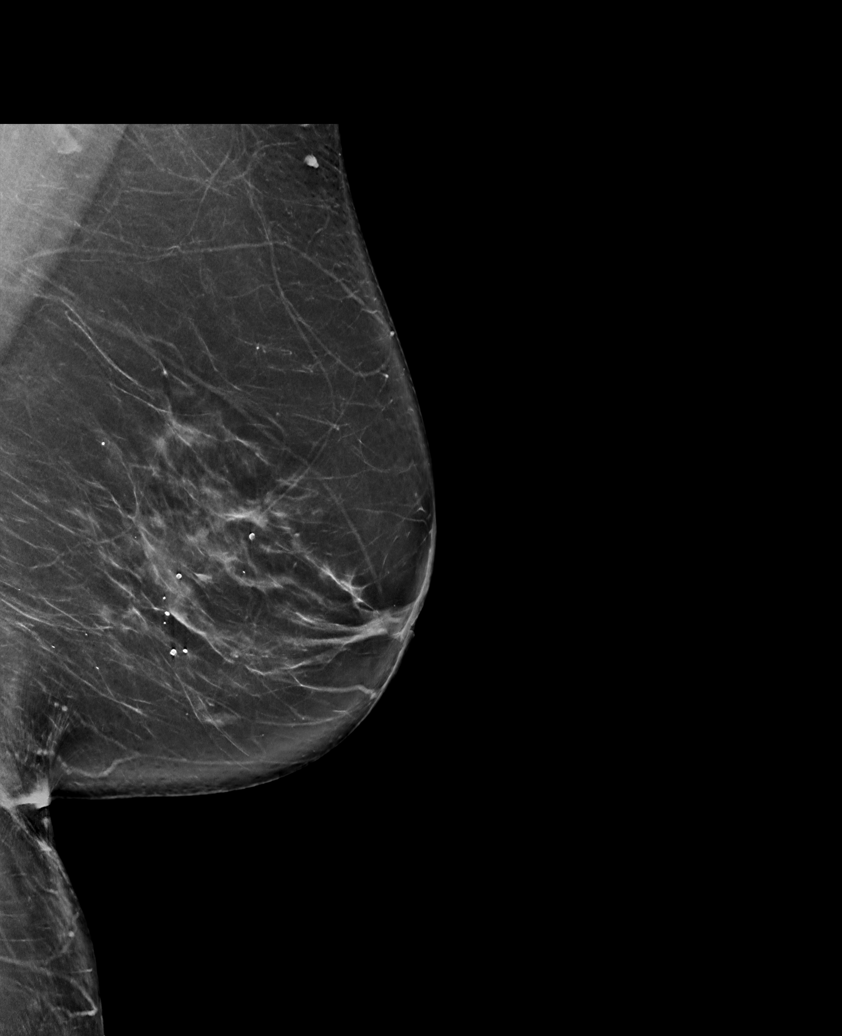

[R CC synth-2D]
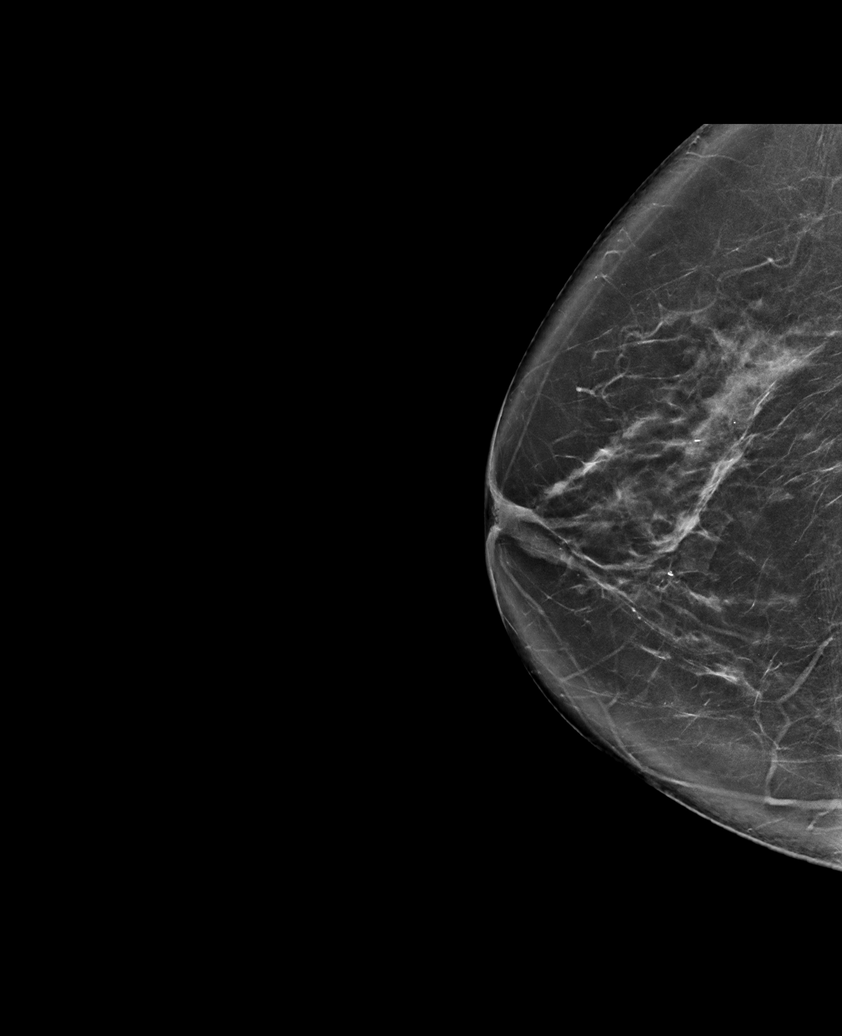

[L CC synth-2D (1 of 2)]
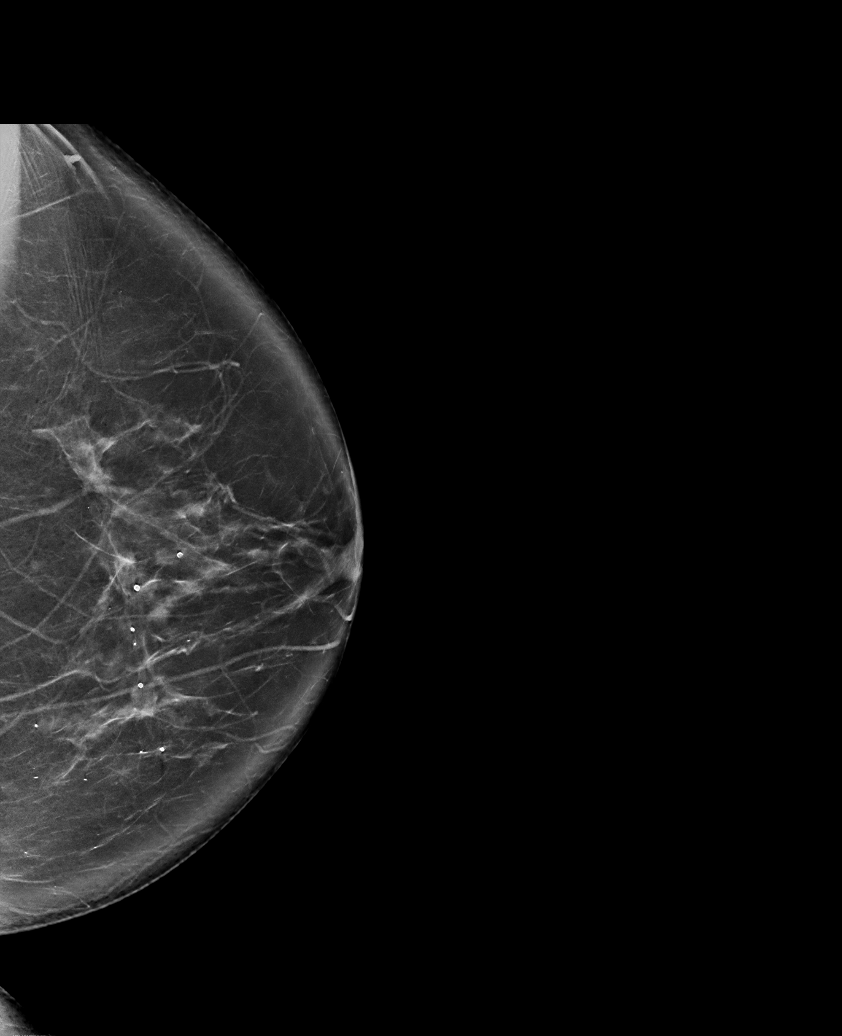

[L CC synth-2D (2 of 2)]
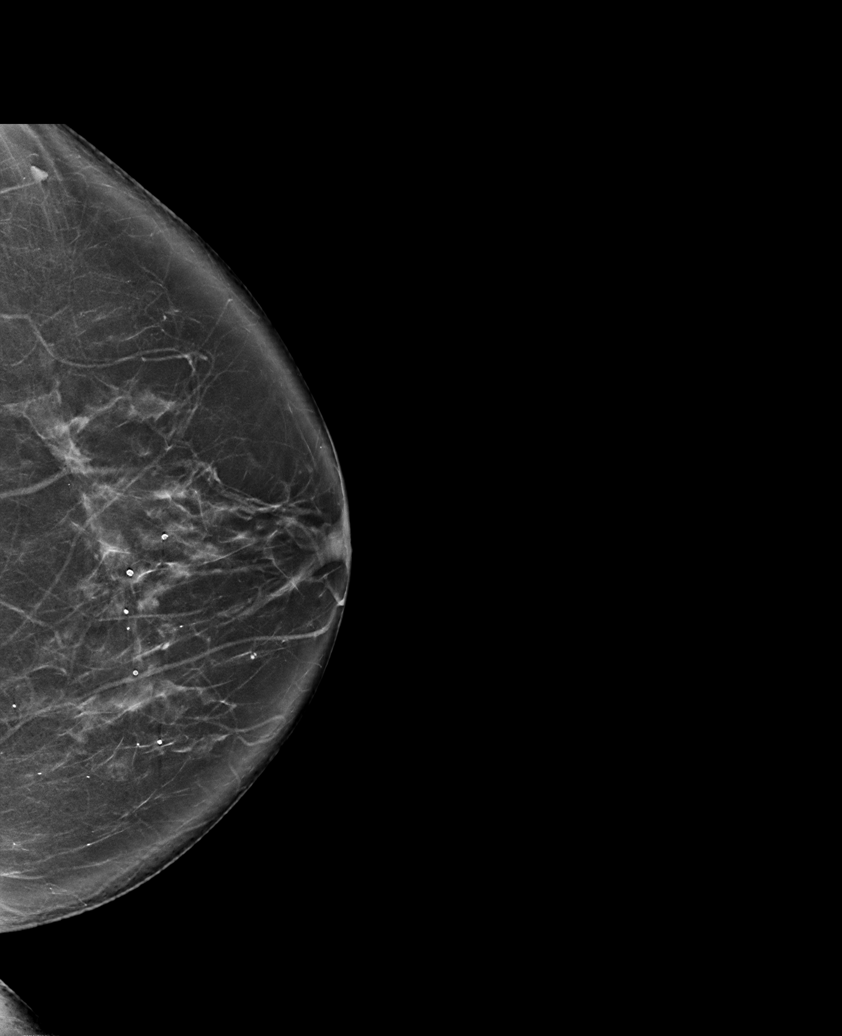

[R MLO synth-2D]
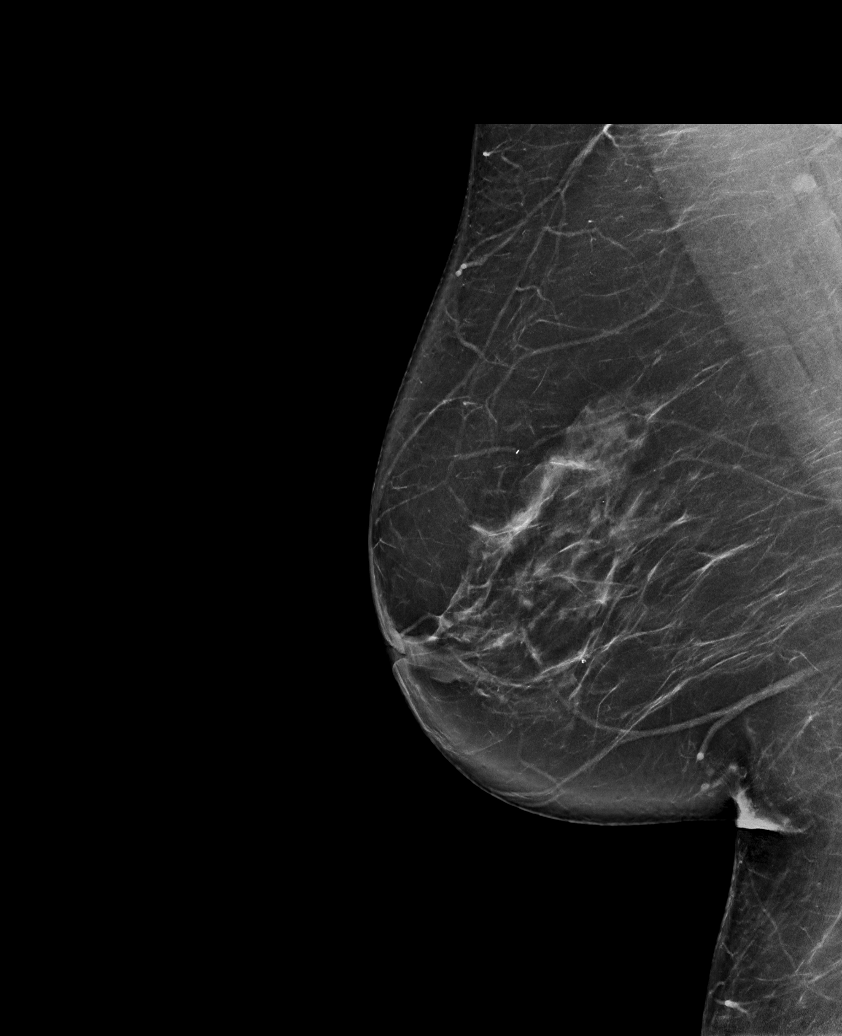

[L CC tomo · tomo slice 42/83.0]
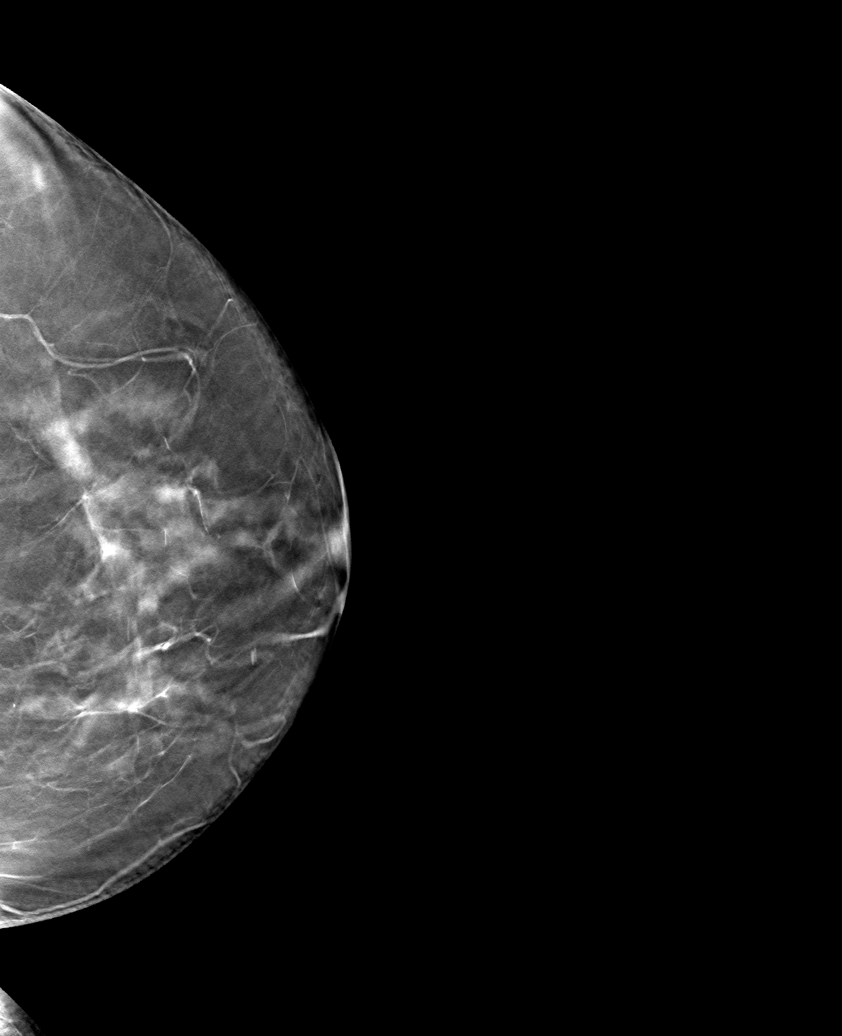

[6 of 30 positions shown; findings below may reference images not displayed]

ACR Breast Density Category b: There are scattered areas of
fibroglandular density.
FINDINGS: In the left breast, a possible asymmetry warrants further
evaluation. In the right breast, no findings suspicious for
malignancy.
IMPRESSION: Further evaluation is suggested for possible asymmetry in the left
breast.

RECOMMENDATION:
Diagnostic mammogram and possibly ultrasound of the left breast.
(Code:[id number])

The patient will be contacted regarding the findings, and additional
imaging will be scheduled.

BI-RADS CATEGORY  0: Incomplete. Need additional imaging evaluation
and/or prior mammograms for comparison.

## 2023-03-12 ENCOUNTER — Ambulatory Visit
Admission: RE | Admit: 2023-03-12 | Discharge: 2023-03-12 | Disposition: A | Discharge status: Home / Self Care | Payer: Medicare HMO | Source: Ambulatory Visit | Attending: Physician Assistant | Admitting: Physician Assistant

## 2023-03-12 DIAGNOSIS — Z1231 Encounter for screening mammogram for malignant neoplasm of breast: Secondary | ICD-10-CM

## 2024-03-17 LAB — COLOGUARD: COLOGUARD: NEGATIVE
